# Patient Record
Sex: Female | Born: 1975 | Race: White | Hispanic: No | Marital: Married | Smoking: Never smoker
Health system: Southern US, Community
[De-identification: ages and names within clinical notes are randomized; demographics above are authoritative.]

## PROBLEM LIST (undated history)

## (undated) DIAGNOSIS — L509 Urticaria, unspecified: Secondary | ICD-10-CM

## (undated) DIAGNOSIS — M419 Scoliosis, unspecified: Secondary | ICD-10-CM

## (undated) DIAGNOSIS — R87619 Unspecified abnormal cytological findings in specimens from cervix uteri: Secondary | ICD-10-CM

## (undated) DIAGNOSIS — F418 Other specified anxiety disorders: Secondary | ICD-10-CM

## (undated) DIAGNOSIS — J302 Other seasonal allergic rhinitis: Secondary | ICD-10-CM

## (undated) DIAGNOSIS — H05839 Thyroid orbitopathy, unspecified orbit: Secondary | ICD-10-CM

## (undated) DIAGNOSIS — E0500 Thyrotoxicosis with diffuse goiter without thyrotoxic crisis or storm: Secondary | ICD-10-CM

## (undated) DIAGNOSIS — B019 Varicella without complication: Secondary | ICD-10-CM

## (undated) DIAGNOSIS — G43909 Migraine, unspecified, not intractable, without status migrainosus: Secondary | ICD-10-CM

## (undated) DIAGNOSIS — T7840XA Allergy, unspecified, initial encounter: Secondary | ICD-10-CM

## (undated) DIAGNOSIS — L719 Rosacea, unspecified: Secondary | ICD-10-CM

## (undated) DIAGNOSIS — M47816 Spondylosis without myelopathy or radiculopathy, lumbar region: Secondary | ICD-10-CM

## (undated) DIAGNOSIS — L930 Discoid lupus erythematosus: Secondary | ICD-10-CM

## (undated) DIAGNOSIS — E559 Vitamin D deficiency, unspecified: Secondary | ICD-10-CM

## (undated) DIAGNOSIS — Z9289 Personal history of other medical treatment: Secondary | ICD-10-CM

## (undated) DIAGNOSIS — K219 Gastro-esophageal reflux disease without esophagitis: Secondary | ICD-10-CM

## (undated) HISTORY — DX: Discoid lupus erythematosus: L93.0

## (undated) HISTORY — DX: Spondylosis without myelopathy or radiculopathy, lumbar region: M47.816

## (undated) HISTORY — DX: Other specified anxiety disorders: F41.8

## (undated) HISTORY — DX: Thyrotoxicosis with diffuse goiter without thyrotoxic crisis or storm: E05.00

## (undated) HISTORY — PX: SKIN LESION EXCISION: SHX2412

## (undated) HISTORY — DX: Vitamin D deficiency, unspecified: E55.9

## (undated) HISTORY — DX: Varicella without complication: B01.9

## (undated) HISTORY — DX: Gastro-esophageal reflux disease without esophagitis: K21.9

## (undated) HISTORY — DX: Rosacea, unspecified: L71.9

## (undated) HISTORY — DX: Personal history of other medical treatment: Z92.89

## (undated) HISTORY — DX: Migraine, unspecified, not intractable, without status migrainosus: G43.909

## (undated) HISTORY — DX: Thyroid orbitopathy, unspecified orbit: H05.839

## (undated) HISTORY — DX: Other seasonal allergic rhinitis: J30.2

## (undated) HISTORY — DX: Allergy, unspecified, initial encounter: T78.40XA

## (undated) HISTORY — DX: Urticaria, unspecified: L50.9

## (undated) HISTORY — DX: Unspecified abnormal cytological findings in specimens from cervix uteri: R87.619

## (undated) HISTORY — DX: Scoliosis, unspecified: M41.9

---
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
## 1998-10-26 HISTORY — PX: WISDOM TOOTH EXTRACTION: SHX21

## 2002-10-26 HISTORY — PX: DILATION AND CURETTAGE OF UTERUS: SHX78

## 2005-05-15 ENCOUNTER — Ambulatory Visit (HOSPITAL_COMMUNITY): Admission: RE | Admit: 2005-05-15 | Discharge: 2005-05-15 | Payer: Self-pay | Admitting: Obstetrics and Gynecology

## 2005-10-26 DIAGNOSIS — R87619 Unspecified abnormal cytological findings in specimens from cervix uteri: Secondary | ICD-10-CM

## 2005-10-26 DIAGNOSIS — F418 Other specified anxiety disorders: Secondary | ICD-10-CM

## 2005-10-26 HISTORY — DX: Other specified anxiety disorders: F41.8

## 2005-10-26 HISTORY — DX: Unspecified abnormal cytological findings in specimens from cervix uteri: R87.619

## 2005-12-25 ENCOUNTER — Inpatient Hospital Stay (HOSPITAL_COMMUNITY): Admission: AD | Admit: 2005-12-25 | Discharge: 2005-12-27 | Payer: Self-pay | Admitting: Obstetrics & Gynecology

## 2006-01-04 ENCOUNTER — Ambulatory Visit (HOSPITAL_COMMUNITY): Admission: RE | Admit: 2006-01-04 | Discharge: 2006-01-04 | Payer: Self-pay | Admitting: Obstetrics & Gynecology

## 2006-01-04 ENCOUNTER — Encounter (INDEPENDENT_AMBULATORY_CARE_PROVIDER_SITE_OTHER): Payer: Self-pay | Admitting: *Deleted

## 2006-02-09 ENCOUNTER — Other Ambulatory Visit: Admission: RE | Admit: 2006-02-09 | Discharge: 2006-02-09 | Payer: Self-pay | Admitting: Obstetrics and Gynecology

## 2009-03-13 ENCOUNTER — Ambulatory Visit: Payer: Self-pay | Admitting: Obstetrics & Gynecology

## 2009-04-24 ENCOUNTER — Ambulatory Visit: Payer: Self-pay | Admitting: Obstetrics & Gynecology

## 2009-08-28 ENCOUNTER — Ambulatory Visit: Payer: Self-pay | Admitting: Obstetrics & Gynecology

## 2009-11-20 ENCOUNTER — Ambulatory Visit: Payer: Self-pay | Admitting: Obstetrics & Gynecology

## 2011-03-10 NOTE — Assessment & Plan Note (Signed)
Amy Delacruz, Amy Delacruz                  ACCOUNT NO.:  [id number]   MEDICAL RECORD NO.:  [id number]          PATIENT TYPE:  POB   LOCATION:  [facility] at [place]         FACILITY:  [facility]   PHYSICIAN:  Elsie Lincoln, MD      DATE OF BIRTH:  03-06-1976   DATE OF SERVICE:  04/24/2009                                  CLINIC NOTE   The patient is a 35 year old female who presents for blood pressure  check and to evaluate progress on Yaz.  The patient is coming only after  being on the pills 1 month.  It was my impression that we were starting  them for rosacea and the patient's perception of hirsutism.  However,  she thinks her period are long and wanted to shorten them a little bit.  They were, again, coming very regularly once a month, 21- to 23-day  cycles.  Since starting on the Yaz, she has had some irregular bleeding,  which can be very common when starting the pill.  We did talk about  this, and she concurs.  We did talk about taking potassium; however, she  is on no other potassium-altering drugs.  I do not think this is  necessary, but I did offer too, and the patient refused.  I would like  to talk with the patient in 2 months, which would be 2 more packs of  Yaz, to see how she is doing.  The patient admits that the most  concerning problem is the hirsutism, so she is willing to put up with  some menstrual irregularities at this point in order to control her  hirsutism.  Blood pressure today is 107/70, which is very good.  The  patient is to come back in September.           ______________________________  Elsie Lincoln, MD     KL/MEDQ  D:  04/24/2009  T:  04/25/2009  Job:  [id number]

## 2011-03-10 NOTE — Assessment & Plan Note (Signed)
Amy Delacruz, Amy Delacruz NO.:  [id number]   MEDICAL RECORD NO.:  [id number]          PATIENT TYPE:  POB   LOCATION:  [facility] at [place]         FACILITY:  [facility]   PHYSICIAN:  Elsie Lincoln, MD      DATE OF BIRTH:  [birth year]   DATE OF SERVICE:                                  CLINIC NOTE   The patient is a 35 year old female who presents to me for her yearly  exam.  Last time she presented to me when she was complaining decrease  libido and this has improved.  She has arranged for her mother to baby  sit.  Her sex life is improved somewhat.  She is still feeling very  exhausted.  She is working with her primary care physician on this.  She  periodically thinks she gets sinusitis and they treat her with Z-PAK.  They have stopped doing because they do not believe she has sinusitis.  They also treat her for low vitamin D level; however, her most recent  vitamin D level is in the 60s, they also ruled her out for anemia  __________ today.  It appears that she has not slept in the night in 7  years.  She gets up at least twice a night from either nightmares or  having to urinate.  She also wakes up when her husband uses the  bathroom.  She also wakes up from her kids waking up from the noise on  the baby monitor.  I think the patient is suffering from long-term sleep  deprivation.  I do not believe that she has sleep apnea, but I think  that she needs better sleep hygiene.  I suggested that we try 2 weeks  Ambien to see if we get her sleeping for 2 weeks to see if this improves  her energy and maybe send her to a sleep specialist if this does not  work.  She also is amendable to a trial of anti-depression medications  if this does not work, but I think that we are owing to something with  the lack of 8-hour sleep for over 7 years.  The patient is also very  anxious.  The patient is on Yasmin and plans to go off it after her  husband's vasectomy.  Post-vasectomy specimen  is clear.   PAST MEDICAL HISTORY:  Rosacea, mild depression, lethargy.   PAST SURGICAL HISTORY:  D and C in 2004, tonsillectomy.   GYN HISTORY:  Abnormal Pap smear after the birth of second child.   OBSTETRICAL HISTORY:  NSVD x2.   FAMILY HISTORY:  No changes.   REVIEW OF SYSTEMS:  As above.   SOCIAL HISTORY:  Still not smoking, occasional drinker, some caffeinated  beverages, and as above.   PHYSICAL EXAMINATION:  VITAL SIGNS:  Pulse 80, blood pressure 106/73,  weight 118, height 61 inches.  GENERAL:  Well nourished, well developed, no apparent stress.  The  patient has an improved affect than the last time I saw her.  THYROID:  No masses.  LUNGS:  Clear to auscultation bilaterally.  HEART:  Regular rate and rhythm.  BREASTS:  No masses, nontender.  No lymphadenopathy.  ABDOMEN:  Soft, nontender.  No organomegaly.  No hernia.  GENITALIA:  Tanner V.  Vagina pink.  Normal rugae.  Cervix closed,  nontender.  Uterus nontender.  Adnexae no masses, nontender.  No  cystocele, no rectocele, no hemorrhoids.  EXTREMITIES:  No edema, nontender.   ASSESSMENT/PLAN:  A 35 year old female with yearly exam.  1. Pap smear.  2. Lack of good sleep hygiene.  Ambien 5-10 mg p.o. at bedtime p.r.n.  3. The patient to call back and see how she is doing a week to be the      consider a trial of anti-depression medication if this does not      work.  See above note for more details.           ______________________________  Elsie Lincoln, MD     KL/MEDQ  D:  11/20/2009  T:  11/21/2009  Job:  [id number]

## 2011-03-10 NOTE — Assessment & Plan Note (Signed)
Amy Delacruz, Amy Delacruz                  ACCOUNT NO.:  [id number]   MEDICAL RECORD NO.:  [id number]          PATIENT TYPE:  POB   LOCATION:  [facility] [place]         FACILITY:  [facility]   PHYSICIAN:  Elsie Lincoln, MD      DATE OF BIRTH:  1975/12/02   DATE OF SERVICE:  03/13/2009                                  CLINIC NOTE   HISTORY:  The patient is a 35 year old G2, P2 female who presents for  hair growth and hormonal issues.  The patient has seen by primary care  doctor, has had normal TSH.  She is on vitamin D supplementations.  She  is also seeing a salon for laser hair removal.  The patient has a  history of rosacea.  She was wondering if the rosacea medications is  causing hair growth.  I did look this up on the Internet, and there was  no associated problem with hair growth.  She is complaining of yellowing  of her skin where she does bleach.  There is interaction between benzoyl  peroxide and the rosacea medications are possibly the peroxide and  bleaching can be reacting with Aczone and informed the patient of this.  The patient also has long periods, but they are regular in timing.  She  spots for several days and then has a heavy flow, it stops for one day,  then has a heavy flow for one day.  This has been consistent for many  years.  She had been on birth pills in the past, but the doctor thought  she should stop them because she thought that may be adding to her  rosacea.   PAST MEDICAL HISTORY:  Rosacea.   PAST MEDICAL SURGICAL HISTORY:  D and C in 2004, tonsillectomy with some  teeth extraction.   GYN HISTORY:  The patient has had abnormal Pap smear after the birth of  second child, she probably required was repeat Pap smears with no  freezing or  burning at the cervix.  No other gynecological history she  reports.   OB HISTORY:  NSVD x2.   CONTRACEPTIVE HISTORY:  She uses condoms for birth control.   FAMILY HISTORY:  Mother and father have blood pressure.   REVIEW OF SYSTEMS:  Negative for all 13 points.   SOCIAL HISTORY:  No smoking.  She drinks 4-6 per week and drinks 2  caffeinated beverages a day.  She lives with her husband, 2 daughters,  and a dog.   PHYSICAL EXAMINATION:  GENERAL:  Well nourished, well developed, no  apparent distress.  HEENT:  Normocephalic and atraumatic.  There is a very minimal hair on  her upper lip, bleached or dark.  The patient does admit that she has  possibly been overpicky, however, I do not see any major hair issue.  There is no hair on her nipples, chin, or abdomen.   ASSESSMENT AND PLAN:  67. A 35 year old female with increased hair growth.  2. We will start oral contraceptives.  We started Yaz.  The      drospirenone help increase sex hormone-binding globulin, also is      very similar to spironolactone and  may help hair growth.  The      patient was warned of increased risk of clots with this drug.  3. The patient to see laser hair removal.  The patient may possibly      need electrolysis as her hair does not appear dark enough to      benefit from laser.  4. Return to clinic in 2 months to check blood pressure.  5. Two samples of Yaz were given.           ______________________________  Elsie Lincoln, MD     KL/MEDQ  D:  03/13/2009  T:  03/14/2009  Job:  [id number]

## 2011-03-10 NOTE — Assessment & Plan Note (Signed)
NAMEMARTINA, BRODBECK NO.:  [id number]   MEDICAL RECORD NO.:  [id number]          PATIENT TYPE:  POB   LOCATION:  [facility] at [place]         FACILITY:  [facility]   PHYSICIAN:  Elsie Lincoln, MD      DATE OF BIRTH:  01-06-1976   DATE OF SERVICE:  08/28/2009                                  CLINIC NOTE   The patient is a 35 year old female who complains of decreased libido  and feeling very tired.  She is also very irritable.  She is variable to  tolerance for things, she just thinks this is Yaz.  We had a long talk  about her daily life, her husband's life, their schedule, and it does  not sound like they have much scheduled time alone.  The patient also  does not have much scheduled time for herself.  She falls into bed  exhausted, around the same time she puts her children into bed, and  there is no time for sex.  She always feels that the children are going  to come into the bedroom, they do, they are in the bedroom in the  morning, so there is no time for sex in the morning.  She is worried  about finances, so she did not hire a baby-sitter.  Her mother is  recently recovering from an illness, who is her usual baby-sitter, and  so she is unable to help now.  We talked about strategies for planning  date nights and plan times for sex and the fact that hiring a baby-  sitter is much less expensive than hiring a counselor and a divorce  attorney, and the patient agreed that she will be scheduling some time  to be with her husband alone and also some alone time for herself where  she can try to relax and pay attention to her own needs.  The patient  will call me back if she feels like that she needs to go on something  for anxiety or depression, at this time she does not.  The patient does  seem to have a strong component of anxiety.           ______________________________  Elsie Lincoln, MD     KL/MEDQ  D:  09/04/2009  T:  09/05/2009  Job:  [id number]

## 2011-03-13 NOTE — Op Note (Signed)
Amy Delacruz, Amy Delacruz                  ACCOUNT NO.:  [id number]   MEDICAL RECORD NO.:  [id number]          PATIENT TYPE:  AMB   LOCATION:  [facility]                           FACILITY:  [facility]   PHYSICIAN:  Freddy Finner, M.D.   DATE OF BIRTH:  [birth year]   DATE OF PROCEDURE:  01/04/2006  DATE OF DISCHARGE:                                 OPERATIVE REPORT   PREOPERATIVE DIAGNOSIS:  Retained placenta.   POSTOPERATIVE DIAGNOSIS:  Retained placenta.   OPERATION/PROCEDURE:  Dilation and evacuation.   SURGEON:  Freddy Finner, M.D.   ANESTHESIA:  Spinal.   ESTIMATED BLOOD LOSS:  200 mL.   COMPLICATIONS:  None.   INDICATIONS:  The patient is a 35 year old who presents now approximately  six days after delivery with persistent heavy vaginal bleeding requiring a  pad about every two to three hours throughout the day.  Initially on her  exam in the office, there was no excessive bleeding noted and the uterus was  approximately 12-14 weeks size on the anterior.  Pelvic ultrasound in the  office did reveal placental fragment.  She is admitted now for resection of  that fragment.  She was admitted on the afternoon of surgery.   DESCRIPTION OF PROCEDURE:  She was placed under spinal anesthesia.  She was  given 1 g of Ancef preoperatively.  She was brought to the operating room  and placed under spinal anesthesia, placed in the dorsal recumbent position.  Betadine prep with __________ vagina carried out.  Sterile drapes were  applied.  Cervix was visualized grasped with a ring forceps on the anterior  lip.  The posterior weighted vaginal retractors were placed.  The large  placental forceps were then used to retrieve tissue. Gentle thorough  curettage was carried out with a Taxus curet, meaning a very large sharp  curet.  Reexploration eventually recovered an approximately 5 x 3 x 3 cm  placental fragment which submitted for histologic examination.  Repeat  curettage and repeat exploration with  placental forceps produced no  additional material.  The patient was given IV Pitocin. She was given IM and  IV Methergine.  She had rather brisk bleeding immediately on completion of  the procedure.  This resolved with these medications and massage.  She was  taken to the recovery room in good condition.   She will be given Darvocet to be taken as needed for postoperative pain.  She is recommended to follow up in the office in one week.  She is to have  progressively increasing physical activity.  No vaginal entry until after  followup in the office in seven to 10 days.  She is to report fever or  recurrent heavy bleeding.      Freddy Finner, M.D.  Electronically Signed     WRN/MEDQ  D:  01/04/2006  T:  01/04/2006  Job:  [id number]

## 2011-11-03 ENCOUNTER — Other Ambulatory Visit: Payer: Self-pay | Admitting: Family Medicine

## 2011-11-03 ENCOUNTER — Ambulatory Visit
Admission: RE | Admit: 2011-11-03 | Discharge: 2011-11-03 | Disposition: A | Discharge status: Home / Self Care | Payer: BC Managed Care – PPO | Source: Ambulatory Visit | Attending: Family Medicine | Admitting: Family Medicine

## 2011-11-03 DIAGNOSIS — M419 Scoliosis, unspecified: Secondary | ICD-10-CM

## 2011-11-03 DIAGNOSIS — M549 Dorsalgia, unspecified: Secondary | ICD-10-CM

## 2011-12-01 ENCOUNTER — Other Ambulatory Visit: Payer: Self-pay | Admitting: Surgery

## 2011-12-01 ENCOUNTER — Other Ambulatory Visit: Payer: Self-pay | Admitting: *Deleted

## 2011-12-01 DIAGNOSIS — M419 Scoliosis, unspecified: Secondary | ICD-10-CM

## 2011-12-01 DIAGNOSIS — M546 Pain in thoracic spine: Secondary | ICD-10-CM

## 2011-12-06 ENCOUNTER — Ambulatory Visit
Admission: RE | Admit: 2011-12-06 | Discharge: 2011-12-06 | Disposition: A | Discharge status: Home / Self Care | Payer: BC Managed Care – PPO | Source: Ambulatory Visit | Attending: *Deleted | Admitting: *Deleted

## 2011-12-06 DIAGNOSIS — M546 Pain in thoracic spine: Secondary | ICD-10-CM

## 2013-12-05 DIAGNOSIS — E0500 Thyrotoxicosis with diffuse goiter without thyrotoxic crisis or storm: Secondary | ICD-10-CM | POA: Insufficient documentation

## 2014-11-06 DIAGNOSIS — H0520 Unspecified exophthalmos: Secondary | ICD-10-CM | POA: Insufficient documentation

## 2014-11-06 DIAGNOSIS — R7611 Nonspecific reaction to tuberculin skin test without active tuberculosis: Secondary | ICD-10-CM

## 2014-11-06 DIAGNOSIS — L930 Discoid lupus erythematosus: Secondary | ICD-10-CM | POA: Insufficient documentation

## 2014-11-06 HISTORY — DX: Nonspecific reaction to tuberculin skin test without active tuberculosis: R76.11

## 2015-01-08 ENCOUNTER — Other Ambulatory Visit: Payer: Self-pay | Admitting: Family Medicine

## 2015-01-08 DIAGNOSIS — Z1231 Encounter for screening mammogram for malignant neoplasm of breast: Secondary | ICD-10-CM

## 2015-01-16 ENCOUNTER — Ambulatory Visit: Payer: Self-pay

## 2015-04-17 ENCOUNTER — Ambulatory Visit (INDEPENDENT_AMBULATORY_CARE_PROVIDER_SITE_OTHER): Payer: 59

## 2015-04-17 DIAGNOSIS — Z1231 Encounter for screening mammogram for malignant neoplasm of breast: Secondary | ICD-10-CM

## 2015-04-17 DIAGNOSIS — R928 Other abnormal and inconclusive findings on diagnostic imaging of breast: Secondary | ICD-10-CM

## 2015-04-18 ENCOUNTER — Other Ambulatory Visit: Payer: Self-pay | Admitting: Family Medicine

## 2015-04-18 DIAGNOSIS — R928 Other abnormal and inconclusive findings on diagnostic imaging of breast: Secondary | ICD-10-CM

## 2015-04-26 ENCOUNTER — Ambulatory Visit
Admission: RE | Admit: 2015-04-26 | Discharge: 2015-04-26 | Disposition: A | Discharge status: Home / Self Care | Payer: 59 | Source: Ambulatory Visit | Attending: Family Medicine | Admitting: Family Medicine

## 2015-04-26 DIAGNOSIS — R928 Other abnormal and inconclusive findings on diagnostic imaging of breast: Secondary | ICD-10-CM

## 2015-09-03 ENCOUNTER — Encounter: Payer: Self-pay | Admitting: Obstetrics & Gynecology

## 2015-09-03 ENCOUNTER — Ambulatory Visit (INDEPENDENT_AMBULATORY_CARE_PROVIDER_SITE_OTHER): Payer: 59 | Admitting: Obstetrics & Gynecology

## 2015-09-03 VITALS — BP 112/70 | HR 82 | Resp 16 | Ht 60.0 in

## 2015-09-03 DIAGNOSIS — N898 Other specified noninflammatory disorders of vagina: Secondary | ICD-10-CM

## 2015-09-03 DIAGNOSIS — N926 Irregular menstruation, unspecified: Secondary | ICD-10-CM | POA: Diagnosis not present

## 2015-09-03 DIAGNOSIS — E0500 Thyrotoxicosis with diffuse goiter without thyrotoxic crisis or storm: Secondary | ICD-10-CM

## 2015-09-03 LAB — POCT URINE PREGNANCY: Preg Test, Ur: NEGATIVE

## 2015-09-04 DIAGNOSIS — E0500 Thyrotoxicosis with diffuse goiter without thyrotoxic crisis or storm: Secondary | ICD-10-CM | POA: Insufficient documentation

## 2015-09-04 LAB — WET PREP BY MOLECULAR PROBE
Candida species: NEGATIVE
Gardnerella vaginalis: NEGATIVE
Trichomonas vaginosis: NEGATIVE

## 2015-09-04 NOTE — Progress Notes (Signed)
   Subjective:    Patient ID: Amy ParsonsDyan D Nuckles, female    DOB: 03/17/1976, 39 y.o.   MRN: [id number]  HPI  Pt present for longer menses the pat 3 months.  Her menses have been very regular and normal with 3 days of spotting then 2 days of heavy bleeding.  This would only occur once a month.  This last menses has lasted 2 weeks.  She is also c/o fould smelling discharge.  No abdominal pain.  Non N,V,D.  Nothing has made these complaints better or worse.  PCP checked her TSH which was normal.  Review of Systems  Constitutional: Negative.   Cardiovascular: Negative.   Gastrointestinal: Negative.   Endocrine: Negative.   Genitourinary: Positive for vaginal bleeding, vaginal discharge, vaginal pain and menstrual problem. Negative for urgency and difficulty urinating.  Psychiatric/Behavioral: Negative.    Past Medical History  Diagnosis Date  . Graves disease   . Scoliosis        Objective:   Physical Exam  Constitutional: She is oriented to person, place, and time. She appears well-developed and well-nourished. No distress.  HENT:  Head: Normocephalic and atraumatic.  Eyes: Conjunctivae are normal.  Pulmonary/Chest: Effort normal.  Abdominal: Soft. Bowel sounds are normal. She exhibits no distension and no mass. There is no tenderness. There is no rebound and no guarding.  Genitourinary:  Ext Gen:  Tanner V Vulva:  No lesion Vagin:  Retained tampon with brown discharge Cervix:  NO lesion Uterus:  Retroverted and nontender Adnexa:  NO masses, NT  Musculoskeletal: She exhibits no edema.  Neurological: She is alert and oriented to person, place, and time.  Skin: Skin is warm and dry.  Psychiatric: She has a normal mood and affect.  Vitals reviewed.         Assessment & Plan:  39 yo female with 2 new problems  1-Menorrhagia--TVUS ordered with Biopsy planned in 1 month if not resolved.  Pt would like to wait on the biospy and not due today.  2-Retained tampon--removal and wet  prep sent.  3-RTC 1 month

## 2016-02-06 ENCOUNTER — Encounter: Payer: Self-pay | Admitting: Obstetrics & Gynecology

## 2016-02-06 ENCOUNTER — Ambulatory Visit (INDEPENDENT_AMBULATORY_CARE_PROVIDER_SITE_OTHER): Payer: 59 | Admitting: Obstetrics & Gynecology

## 2016-02-06 VITALS — BP 101/63 | HR 75 | Resp 16 | Ht 60.0 in | Wt 114.0 lb

## 2016-02-06 DIAGNOSIS — Z1151 Encounter for screening for human papillomavirus (HPV): Secondary | ICD-10-CM | POA: Diagnosis not present

## 2016-02-06 DIAGNOSIS — Z01419 Encounter for gynecological examination (general) (routine) without abnormal findings: Secondary | ICD-10-CM | POA: Diagnosis not present

## 2016-02-06 DIAGNOSIS — Z124 Encounter for screening for malignant neoplasm of cervix: Secondary | ICD-10-CM

## 2016-02-06 NOTE — Progress Notes (Signed)
Subjective:    Amy Delacruz is a 40 y.o. "happily" MW P2 (313 and 42 yo daughters) female who presents for an annual exam. The patient has no complaints today. The patient is sexually active. GYN screening history: last pap: was normal. The patient wears seatbelts: yes. The patient participates in regular exercise: yes. (walking)  Has the patient ever been transfused or tattooed?: no. The patient reports that there is not domestic violence in her life.   Menstrual History: OB History    Gravida Para Term Preterm AB TAB SAB Ectopic Multiple Living   2 2 2       2       Menarche age: [age]  Patient's last menstrual period was 01/20/2016.    The following portions of the patient's history were reviewed and updated as appropriate: allergies, current medications, past family history, past medical history, past social history, past surgical history and problem list.  Review of Systems Pertinent items noted in HPI and remainder of comprehensive ROS otherwise negative.  Monogamous since high school. Denies dyspareunia. He had a vasectomy. 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 She has a low libido but doesn't want meds. She has some left nipple sensitivity monthly and has noticed some staining (clear) in the left cup of her bra over the last year.   Objective:    BP 101/63 mmHg  Pulse 75  Resp 16  Ht 5' (1.524 m)  Wt 114 lb (51.71 kg)  BMI 22.26 kg/m2  LMP 01/20/2016  General Appearance:    Alert, cooperative, no distress, appears stated age  Head:    Normocephalic, without obvious abnormality, atraumatic  Eyes:    PERRL, conjunctiva/corneas clear, EOM's intact, fundi    benign, both eyes  Ears:    Normal TM's and external ear canals, both ears  Nose:   Nares normal, septum midline, mucosa normal, no drainage    or sinus tenderness  Throat:   Lips, mucosa, and tongue normal; teeth and gums normal  Neck:   Supple, symmetrical, trachea midline, no adenopathy;    thyroid:  no  enlargement/tenderness/nodules; no carotid   bruit or JVD  Back:     Symmetric, no curvature, ROM normal, no CVA tenderness  Lungs:     Clear to auscultation bilaterally, respirations unlabored  Chest Wall:    No tenderness or deformity   Heart:    Regular rate and rhythm, S1 and S2 normal, no murmur, rub   or gallop  Breast Exam:    No tenderness, masses, or nipple abnormality  Abdomen:     Soft, non-tender, bowel sounds active all four quadrants,    no masses, no organomegaly  Genitalia:    Normal female without lesion, discharge or tenderness,NSSmid, NT     Extremities:   Extremities normal, atraumatic, no cyanosis or edema  Pulses:   2+ and symmetric all extremities  Skin:   Skin color, texture, turgor normal, no rashes or lesions  Lymph nodes:   Cervical, supraclavicular, and axillary nodes normal  Neurologic:   CNII-XII intact, normal strength, sensation and reflexes    throughout  .    Assessment:    Healthy female exam.    Plan:     Thin prep Pap smear. with cotesting Discussed mammo rec She will get a TSH and AM prolactin drawn tomorrow

## 2016-02-10 ENCOUNTER — Encounter: Payer: Self-pay | Admitting: Obstetrics & Gynecology

## 2016-02-11 LAB — CYTOLOGY - PAP

## 2016-02-20 ENCOUNTER — Ambulatory Visit (INDEPENDENT_AMBULATORY_CARE_PROVIDER_SITE_OTHER): Payer: 59 | Admitting: Obstetrics & Gynecology

## 2016-02-20 ENCOUNTER — Encounter: Payer: Self-pay | Admitting: Obstetrics & Gynecology

## 2016-02-20 VITALS — BP 104/70 | HR 74 | Resp 16 | Ht 60.0 in | Wt 114.0 lb

## 2016-02-20 DIAGNOSIS — Z01419 Encounter for gynecological examination (general) (routine) without abnormal findings: Secondary | ICD-10-CM

## 2016-02-20 DIAGNOSIS — Z0000 Encounter for general adult medical examination without abnormal findings: Secondary | ICD-10-CM

## 2016-02-20 NOTE — Progress Notes (Signed)
   Subjective:    Patient ID: Amy Delacruz, female    DOB: 11/05/1975, 40 y.o.   MRN: [id number]  HPI She is here for a breast exam.   Review of Systems     Objective:   Physical Exam Normal breast exam bilaterally       Assessment & Plan:  She reports that her thyroid meds were lowered by her primary care doc and her prolactin was normal

## 2016-02-24 ENCOUNTER — Ambulatory Visit: Payer: 59 | Admitting: Obstetrics & Gynecology

## 2016-05-04 ENCOUNTER — Other Ambulatory Visit: Payer: Self-pay | Admitting: Family Medicine

## 2016-05-04 DIAGNOSIS — Z1231 Encounter for screening mammogram for malignant neoplasm of breast: Secondary | ICD-10-CM

## 2016-05-20 ENCOUNTER — Ambulatory Visit (INDEPENDENT_AMBULATORY_CARE_PROVIDER_SITE_OTHER): Payer: 59

## 2016-05-20 DIAGNOSIS — R928 Other abnormal and inconclusive findings on diagnostic imaging of breast: Secondary | ICD-10-CM | POA: Diagnosis not present

## 2016-05-20 DIAGNOSIS — Z1231 Encounter for screening mammogram for malignant neoplasm of breast: Secondary | ICD-10-CM

## 2016-05-21 ENCOUNTER — Other Ambulatory Visit: Payer: Self-pay | Admitting: Family Medicine

## 2016-05-21 DIAGNOSIS — R928 Other abnormal and inconclusive findings on diagnostic imaging of breast: Secondary | ICD-10-CM

## 2016-05-26 ENCOUNTER — Ambulatory Visit
Admission: RE | Admit: 2016-05-26 | Discharge: 2016-05-26 | Disposition: A | Discharge status: Home / Self Care | Payer: 59 | Source: Ambulatory Visit | Attending: Family Medicine | Admitting: Family Medicine

## 2016-05-26 DIAGNOSIS — R928 Other abnormal and inconclusive findings on diagnostic imaging of breast: Secondary | ICD-10-CM

## 2016-05-29 ENCOUNTER — Other Ambulatory Visit: Payer: 59

## 2016-08-26 DIAGNOSIS — L509 Urticaria, unspecified: Secondary | ICD-10-CM

## 2016-08-26 HISTORY — DX: Urticaria, unspecified: L50.9

## 2016-09-28 ENCOUNTER — Encounter: Payer: Self-pay | Admitting: Family Medicine

## 2016-10-06 ENCOUNTER — Telehealth: Payer: Self-pay | Admitting: Family Medicine

## 2016-10-06 ENCOUNTER — Encounter: Payer: Self-pay | Admitting: Family Medicine

## 2016-10-06 ENCOUNTER — Ambulatory Visit (INDEPENDENT_AMBULATORY_CARE_PROVIDER_SITE_OTHER): Payer: 59 | Admitting: Family Medicine

## 2016-10-06 VITALS — BP 107/73 | HR 83 | Temp 97.9°F | Resp 20 | Ht 60.0 in | Wt 112.2 lb

## 2016-10-06 DIAGNOSIS — E0500 Thyrotoxicosis with diffuse goiter without thyrotoxic crisis or storm: Secondary | ICD-10-CM | POA: Diagnosis not present

## 2016-10-06 DIAGNOSIS — F411 Generalized anxiety disorder: Secondary | ICD-10-CM | POA: Diagnosis not present

## 2016-10-06 DIAGNOSIS — Z7689 Persons encountering health services in other specified circumstances: Secondary | ICD-10-CM

## 2016-10-06 DIAGNOSIS — L508 Other urticaria: Secondary | ICD-10-CM

## 2016-10-06 DIAGNOSIS — L930 Discoid lupus erythematosus: Secondary | ICD-10-CM

## 2016-10-06 DIAGNOSIS — L509 Urticaria, unspecified: Secondary | ICD-10-CM

## 2016-10-06 HISTORY — DX: Other urticaria: L50.8

## 2016-10-06 LAB — TSH: TSH: 2.54 u[IU]/mL (ref 0.35–4.50)

## 2016-10-06 LAB — T4, FREE: Free T4: 1 ng/dL (ref 0.60–1.60)

## 2016-10-06 MED ORDER — ESCITALOPRAM OXALATE 10 MG PO TABS: 10.0000 mg | ORAL_TABLET | Freq: Every day | ORAL | 0 refills | Status: DC

## 2016-10-06 MED ORDER — HYDROXYZINE PAMOATE 25 MG PO CAPS: 25.0000 mg | ORAL_CAPSULE | Freq: Every evening | ORAL | 1 refills | Status: DC | PRN

## 2016-10-06 MED ORDER — RANITIDINE HCL 150 MG PO TABS: 150.0000 mg | ORAL_TABLET | Freq: Two times a day (BID) | ORAL | 2 refills | Status: DC

## 2016-10-06 MED ORDER — FEXOFENADINE HCL 180 MG PO TABS: 180.0000 mg | ORAL_TABLET | Freq: Every day | ORAL | 0 refills | Status: DC

## 2016-10-06 NOTE — Progress Notes (Signed)
Patient ID: Amy Delacruz, female  DOB: 08/11/1976, 40 y.o.   MRN: [id number] Patient Care Team    Relationship Specialty Notifications Start End  Natalia Leatherwoodenee A Kuneff, DO PCP - General Family Medicine  10/06/16     Subjective:  Amy Delacruz is a 40 y.o.  female present for new patient establishment. All past medical history, surgical history, allergies, family history, immunizations, medications and social history were obtained and updated in the electronic medical record today. All recent labs, ED visits and hospitalizations within the last year were reviewed.  Urticaria: pt presents for establishment visit with complaints of 1 month duration of hives. She reports they are worse at night, forming mostly during sleeping hours. She has not changed any products, washing detergent etc. She reports having them 3 times in her life, nad it is always around this time of the year (2009,2011 and now). She is under a great deal of stress with her daughter having behavioral problems, and they are building a home. She states the home is costing "a lot" of money and it is stressing her out. She is always stressed around christmas as well. She has a h/o grave's disease, and is currently taking methimazole 5 mg QOD since 09/16/16. She states her TSH was 2.846 on that date, and she was taking 5 mg/2.5 mg alternating days methimazole at that time. She is taking claritin daily. She has never taken anything for anxiety in the past. She endorses difficulty sleeping because her "brain keeps turning". She states her husband feels she should try something for anxiety.    Health maintenance:  Colonoscopy: No fhx. Screen 48 Mammogram: no fhx, 05/26/2016 birads 1. Cervical cancer screening: last pap: 2017, has a gyn.  Immunizations: tdap 2012, Influenza 2017 UTD (encouraged yearly) Infectious disease screening: HIV completed 1997 DEXA: N/A [facility] 10/06/2016 10/06/2016  Decreased Interest 1 0  Down,  Depressed, Hopeless 0 0  PHQ - 2 Score 1 0   GAD 7 : Generalized Anxiety Score 10/06/2016  Nervous, Anxious, on Edge 3  Control/stop worrying 3  Worry too much - different things 3  Trouble relaxing 0  Restless 0  Easily annoyed or irritable 2  Afraid - awful might happen 2  Total GAD 7 Score 13  Anxiety Difficulty Not difficult at all      Immunization History  Administered Date(s) Administered  . Influenza-Unspecified 06/27/2015, 06/26/2016  . Tdap 12/05/2010     Past Medical History:  Diagnosis Date  . Abnormal Pap smear of cervix 2007  . Allergy   . Depression with anxiety 2007  . GERD (gastroesophageal reflux disease)   . Graves disease   . Graves' ophthalmopathy    follows with ophthalmology  . History of positive PPD    cxr negative  . Lupus erythematosus    managed by dermatology   . Migraine   . Rosacea   . Scoliosis   . Seasonal allergies   . Urticaria 08/2016  . Varicella   . Vitamin D deficiency    No Known Allergies Past Surgical History:  Procedure Laterality Date  . DILATION AND CURETTAGE OF UTERUS  2004   2004,2007  . SKIN LESION EXCISION    . TONSILLECTOMY  1978  . WISDOM TOOTH EXTRACTION  2000   Family History  Problem Relation Age of Onset  . Breast cancer Paternal Grandmother [id number]  . Hypertension Mother   . Lung disease Mother   . Hypertension Father   .  Bleeding Disorder Father   . Stroke Maternal Grandmother   . Hypertension Maternal Grandmother   . Depression Sister   . Depression Maternal Aunt    Social History   Social History  . Marital status: Married    Spouse name: Elijah Birk  . Number of children: 2  . Years of education: Post grad   Occupational History  . stay at home mother    Social History Main Topics  . Smoking status: Never Smoker  . Smokeless tobacco: Never Used  . Alcohol use 2.4 oz/week    4 Cans of beer per week  . Drug use: No  . Sexual activity: Yes    Partners: Male    Birth control/ protection:  Surgical     Comment: husband   Other Topics Concern  . Not on file   Social History Narrative   Married to [place]. 2 children Amy Delacruz and Amy Delacruz.    SAHM. Post grad education.    Nonsmoker.    Drinks caffeine, takes a daily vitamin   Wears seatbelt, bicycle helmet, exercises routinely.    Smoke detector in the home.      Medication List       Accurate as of 10/06/16 12:27 PM. Always use your most recent med list.          fexofenadine 180 MG tablet Commonly known as:  ALLEGRA ALLERGY Take 1 tablet (180 mg total) by mouth daily.   hydrOXYzine 25 MG capsule Commonly known as:  VISTARIL Take 1-2 capsules (25-50 mg total) by mouth at bedtime as needed.   ibuprofen 600 MG tablet Commonly known as:  ADVIL,MOTRIN Take 600 mg by mouth.   methimazole 5 MG tablet Commonly known as:  TAPAZOLE Take 2.5 mg by mouth.   multivitamin tablet Take by mouth.   ranitidine 150 MG tablet Commonly known as:  ZANTAC Take 1 tablet (150 mg total) by mouth 2 (two) times daily.   RELPAX 20 MG tablet Generic drug:  eletriptan TAKE ONE TABLET BY MOUTH AT ONSET OF HEADACHE AND MAY REPEAT IN 2 HOURS IF NEEDED   triamcinolone cream 0.1 % Commonly known as:  KENALOG Apply topically.   Vitamin D-3 1000 units Caps Take 1 tablet by mouth.        No results found for this or any previous visit (from the past 2160 hour(s)).  US Breast Ltd Uni Left Inc Axilla  Result Date: 05/26/2016 CLINICAL DATA:  Screening recall for left breast asymmetry. EXAM: 2D DIGITAL DIAGNOSTIC UNILATERAL LEFT MAMMOGRAM WITH CAD AND ADJUNCT TOMO LEFT BREAST ULTRASOUND COMPARISON:  Previous exam(s). ACR Breast Density Category c: The breast tissue is heterogeneously dense, which may obscure small masses. FINDINGS: The asymmetry in the lower-inner aspect of the left breast appears to resolve with additional diagnostic images, however, due to the density of breast tissue in this area, ultrasound will be performed for  further evaluation. Mammographic images were processed with CAD. Physical exam of the lower-inner aspect of the left breast demonstrates normal fibroglandular tissue without discrete palpable mass. Ultrasound of the lower-inner left breast demonstrates normal fibroglandular tissue. No masses or suspicious areas of shadowing are identified. IMPRESSION: 1. There is resolution of the asymmetry of concern in the lower-inner aspect of the left breast. No mammographic or targeted sonographic evidence of left breast malignancy. RECOMMENDATION: Screening mammogram in one year.(Code:SM-B-01Y) I have discussed the findings and recommendations with the patient. Results were also provided in writing at the conclusion of the visit. If applicable, a  reminder letter will be sent to the patient regarding the next appointment. BI-RADS CATEGORY  1: Negative. Electronically Signed   By: Frederico HammanMichelle  Collins M.D.   On: 05/26/2016 14:15   Mm Diag Breast Tomo Uni Left  Result Date: 05/26/2016 CLINICAL DATA:  Screening recall for left breast asymmetry. EXAM: 2D DIGITAL DIAGNOSTIC UNILATERAL LEFT MAMMOGRAM WITH CAD AND ADJUNCT TOMO LEFT BREAST ULTRASOUND COMPARISON:  Previous exam(s). ACR Breast Density Category c: The breast tissue is heterogeneously dense, which may obscure small masses. FINDINGS: The asymmetry in the lower-inner aspect of the left breast appears to resolve with additional diagnostic images, however, due to the density of breast tissue in this area, ultrasound will be performed for further evaluation. Mammographic images were processed with CAD. Physical exam of the lower-inner aspect of the left breast demonstrates normal fibroglandular tissue without discrete palpable mass. Ultrasound of the lower-inner left breast demonstrates normal fibroglandular tissue. No masses or suspicious areas of shadowing are identified. IMPRESSION: 1. There is resolution of the asymmetry of concern in the lower-inner aspect of the left  breast. No mammographic or targeted sonographic evidence of left breast malignancy. RECOMMENDATION: Screening mammogram in one year.(Code:SM-B-01Y) I have discussed the findings and recommendations with the patient. Results were also provided in writing at the conclusion of the visit. If applicable, a reminder letter will be sent to the patient regarding the next appointment. BI-RADS CATEGORY  1: Negative. Electronically Signed   By: Frederico HammanMichelle  Collins M.D.   On: 05/26/2016 14:15     ROS: 14 pt review of systems performed and negative (unless mentioned in an HPI)  Objective: BP 107/73 (BP Location: Right Arm, Patient Position: Sitting, Cuff Size: Normal)   Pulse 83   Temp 97.9 F (36.6 C)   Resp 20   Ht 5' (1.524 m)   Wt 112 lb 4 oz (50.9 kg)   SpO2 98%   BMI 21.92 kg/m  Gen: Afebrile. No acute distress. Nontoxic in appearance, well-developed, well-nourished,  Pleasant caucasian female.  HENT: AT. [place].  MMM Eyes:Pupils Equal Round Reactive to light, Extraocular movements intact,  Conjunctiva without redness, discharge or icterus. Neck/lymp/endocrine: Supple,no lymphadenopathy, no thyromegaly CV: RRR, no murmur Chest: CTAB, no wheeze, rhonchi or crackles.  Abd: Soft.NTND. BS present.  Skin: no rashes, purpura or petechiae.  Neuro/Msk: Normal gait. PERLA. EOMi. Alert. Oriented x3.   Psych: mild anxiety. Otherwise, Normal affect, dress and demeanor. Normal speech. Normal thought content and judgment.   Assessment/plan: Kendyll D Wenig is a 40 y.o. female present for new pt establishment with acute complaint.  Urticaria/anxiety: - urticaria resolved by appt, occurs nightly. Discussed urticaria can have many causes and the majority of cases the cause is not well defined. In her case thyroid and/or anxiety could be he potential triggers. Thyroid levels were normal during the first presentation of her hives in November.  - will repeat her levels today. Mostly bc she has started to take less of  her medication and her levels were normal then. Concern she will require 5/2.5 mg alternated of methimazole.  - Will prescribed allegra and zantac for daytime use to block Histamine, and vistaril at night to help with stress, sleep and hives.Vistaril 25-50 mg QHS dependent on sedation and hives.  - discussed her anxiety in detail and potential medications. Discussed the properties of vistaril and stress, pt decided she did not want to start daily SSRI/SNRI at this time. She then called back in the office after her appt and decided she did want  to start a medication.  - depression and anxiety assessments completed with GAD (13):  lexapro 10 mg QD - ranitidine (ZANTAC) 150 MG tablet; Take 1 tablet (150 mg total) by mouth 2 (two) times daily.  Dispense: 60 tablet; Refill: 2 - hydrOXYzine (VISTARIL) 25 MG capsule; Take 1-2 capsules (25-50 mg total) by mouth at bedtime as needed.  Dispense: 60 capsule; Refill: 1 - TSH - fexofenadine (ALLEGRA ALLERGY) 180 MG tablet; Take 1 tablet (180 mg total) by mouth daily.  Dispense: 90 tablet; Refill: 0 - f/u 4 weeks   Graves disease - she has an endocrinologist, however she does not got them frequently.  - I have concerns she self lowered her medication, when her panel was normal ~2 weeks ago.  - she has an ophthalmologist to follow eye exams as well.  - TSH - T3 - T4, free - pt will be called with results and dose adjusted If needed.   Return in about 4 weeks (around 11/03/2016), or Hives.  Greater than 45 minutes was spent with patient, greater than 50% of that time was spent face-to-face with patient    Electronically signed by: Felix Pacini, DO [facility] [place]

## 2016-10-06 NOTE — Telephone Encounter (Signed)
I have called in lexapro 10 mg for her to start. This is a daily medication. This is a low dose. Followup in 4 weeks (before medication runs out) and if needed can increase dose at that time.

## 2016-10-06 NOTE — Telephone Encounter (Signed)
Patient has decided she would like to try the anti anxiety medication that was mentioned during her office visit. [facility]

## 2016-10-06 NOTE — Patient Instructions (Signed)
Start the allegra in the morning.  Zantac every 12 hours.  Vistaril about 1 hour before bed. Start with 1 pill at night, you can take up to 2 pills before bed if needed (based on sedation and hives).     It was a pleasure meeting you.  F/U 4 weeks, sooner if needed.

## 2016-10-06 NOTE — Telephone Encounter (Signed)
Spoke with patient reviewed  instructions. Patient verbalized understanding. 

## 2016-10-07 ENCOUNTER — Telehealth: Payer: Self-pay | Admitting: Family Medicine

## 2016-10-07 DIAGNOSIS — E0500 Thyrotoxicosis with diffuse goiter without thyrotoxic crisis or storm: Secondary | ICD-10-CM

## 2016-10-07 DIAGNOSIS — Z5181 Encounter for therapeutic drug level monitoring: Secondary | ICD-10-CM

## 2016-10-07 LAB — T3: T3, Total: 103 ng/dL (ref 76–181)

## 2016-10-07 NOTE — Telephone Encounter (Signed)
Please call patient: Her thyroid panel is completely normal, all parameters are within normal. She should continue the current dose of medication she is taking of methimazole, and we can discuss further at her follow-up appointment in a couple weeks.

## 2016-10-07 NOTE — Telephone Encounter (Signed)
Patient notified and verbalized understanding. 

## 2016-10-13 ENCOUNTER — Telehealth: Payer: Self-pay | Admitting: *Deleted

## 2016-10-13 NOTE — Telephone Encounter (Signed)
Pt will have hives, the antihistamine will help and it can take time to calm down the response system that causes hives. The antihistamines should not cause abd pain. Small potential the lexapro could cause abd discomfort and nausea.  We could try a different medicine for anxiety. I would encouraged her to restart the medications for the hives.

## 2016-10-13 NOTE — Telephone Encounter (Signed)
Patient called and left message stating she is still having hives but now also has abdominal pain. She has discontinued her medications because she thinks this is causing the abdominal pain. Please advise.

## 2016-10-14 NOTE — Telephone Encounter (Signed)
Left detailed message on patient voice mail with instructions and information. Instructed patient to call back if she would like to try different medication for anxiety.

## 2016-10-28 ENCOUNTER — Encounter: Payer: Self-pay | Admitting: Family Medicine

## 2016-10-30 ENCOUNTER — Ambulatory Visit: Payer: 59 | Admitting: Family Medicine

## 2016-11-03 ENCOUNTER — Encounter: Payer: Self-pay | Admitting: Family Medicine

## 2016-11-03 ENCOUNTER — Ambulatory Visit (INDEPENDENT_AMBULATORY_CARE_PROVIDER_SITE_OTHER): Payer: 59 | Admitting: Family Medicine

## 2016-11-03 VITALS — BP 108/73 | HR 73 | Temp 97.9°F | Resp 20 | Ht 60.0 in | Wt 113.0 lb

## 2016-11-03 DIAGNOSIS — F411 Generalized anxiety disorder: Secondary | ICD-10-CM | POA: Diagnosis not present

## 2016-11-03 DIAGNOSIS — L509 Urticaria, unspecified: Secondary | ICD-10-CM | POA: Diagnosis not present

## 2016-11-03 MED ORDER — DOXEPIN HCL 10 MG PO CAPS: 10.0000 mg | ORAL_CAPSULE | Freq: Every day | ORAL | 2 refills | Status: DC

## 2016-11-03 NOTE — Progress Notes (Signed)
Patient ID: [name], female  DOB: 03-22-1976, 41 y.o.   MRN: [id number] Patient Care Team    Relationship Specialty Notifications Start End  Amy Leatherwood, DO PCP - General Family Medicine  10/06/16   Amy Fillers, MD    10/07/16    Comment: endocrine  Amy Grana, MD Referring Physician Dermatology  10/07/16   Amy Bossier, MD Consulting Physician Obstetrics and Gynecology  10/07/16     Subjective:  Amy Delacruz is a 41 y.o.  female present for new patient establishment. All past medical history, surgical history, allergies, family history, immunizations, medications and social history were obtained and updated in the electronic medical record today. All recent labs, ED visits and hospitalizations within the last year were reviewed.  Urticaria: pt presents for establishment visit with complaints of 1 month duration of hives. She reports they are worse at night, forming mostly during sleeping hours. She has not changed any products, washing detergent etc. She reports having them 3 times in her life, nad it is always around this time of the year (2009,2011 and now). She is under a great deal of stress with her daughter having behavioral problems, and they are building a home. She states the home is costing "a lot" of money and it is stressing her out. She is always stressed around christmas as well. She has a h/o grave's disease, and is currently taking methimazole 5 mg QOD since 09/16/16. She states her TSH was 2.846 on that date, and she was taking 5 mg/2.5 mg alternating days methimazole at that time. She is taking claritin daily. She has never taken anything for anxiety in the past. She endorses difficulty sleeping because her "brain keeps turning". She states her husband feels she should try something for anxiety.    Health maintenance:  Colonoscopy: No fhx. Screen 48 Mammogram: no fhx, 05/26/2016 birads 1. Cervical cancer screening: last pap: 2017, has a gyn.  Immunizations:  tdap 2012, Influenza 2017 UTD (encouraged yearly) Infectious disease screening: HIV completed 1997 DEXA: N/A Depression screen [facility] 2/9 10/06/2016 10/06/2016  Decreased Interest 1 0  Down, Depressed, Hopeless 0 0  PHQ - 2 Score 1 0   GAD 7 : Generalized Anxiety Score 10/06/2016  Nervous, Anxious, on Edge 3  Control/stop worrying 3  Worry too much - different things 3  Trouble relaxing 0  Restless 0  Easily annoyed or irritable 2  Afraid - awful might happen 2  Total GAD 7 Score 13  Anxiety Difficulty Not difficult at all      Immunization History  Administered Date(s) Administered  . Influenza-Unspecified 06/27/2015, 06/26/2016  . Tdap 12/05/2010     Past Medical History:  Diagnosis Date  . Abnormal Pap smear of cervix 2007  . Allergy   . Depression with anxiety 2007  . GERD (gastroesophageal reflux disease)   . Graves disease   . Graves' ophthalmopathy    follows with ophthalmology  . History of positive PPD    cxr negative  . Lupus erythematosus    managed by dermatology   . Migraine   . Rosacea   . Scoliosis   . Seasonal allergies   . Urticaria 08/2016  . Varicella   . Vitamin D deficiency    No Known Allergies Past Surgical History:  Procedure Laterality Date  . DILATION AND CURETTAGE OF UTERUS  2004   2004,2007  . SKIN LESION EXCISION    . TONSILLECTOMY  1978  . WISDOM  TOOTH EXTRACTION  2000   Family History  Problem Relation Age of Onset  . Breast cancer Paternal Grandmother 7350  . Hypertension Mother   . Lung disease Mother   . Hypertension Father   . Bleeding Disorder Father   . Stroke Maternal Grandmother   . Hypertension Maternal Grandmother   . Depression Sister   . Depression Maternal Aunt    Social History   Social History  . Marital status: Married    Spouse name: Amy Delacruz  . Number of children: 2  . Years of education: Post grad   Occupational History  . stay at home mother    Social History Main Topics  . Smoking status:  Never Smoker  . Smokeless tobacco: Never Used  . Alcohol use 2.4 oz/week    4 Cans of beer per week  . Drug use: No  . Sexual activity: Yes    Partners: Male    Birth control/ protection: Surgical     Comment: husband   Other Topics Concern  . Not on file   Social History Narrative   Married to Amy Delacruz. 2 children Amy BradfordKimberly and Amy Delacruz.    SAHM. Post grad education.    Nonsmoker.    Drinks caffeine, takes a daily vitamin   Wears seatbelt, bicycle helmet, exercises routinely.    Smoke detector in the home.    Allergies as of 11/03/2016   No Known Allergies     Medication List       Accurate as of 11/03/16 11:54 AM. Always use your most recent med list.          doxepin 10 MG capsule Commonly known as:  SINEQUAN Take 1 capsule (10 mg total) by mouth at bedtime.   escitalopram 10 MG tablet Commonly known as:  LEXAPRO Take 1 tablet (10 mg total) by mouth daily.   fexofenadine 180 MG tablet Commonly known as:  ALLEGRA ALLERGY Take 1 tablet (180 mg total) by mouth daily.   hydrOXYzine 25 MG capsule Commonly known as:  VISTARIL Take 1-2 capsules (25-50 mg total) by mouth at bedtime as needed.   ibuprofen 600 MG tablet Commonly known as:  ADVIL,MOTRIN Take 600 mg by mouth.   methimazole 5 MG tablet Commonly known as:  TAPAZOLE Take 2.5 mg by mouth.   multivitamin tablet Take by mouth.   ranitidine 150 MG tablet Commonly known as:  ZANTAC Take 1 tablet (150 mg total) by mouth 2 (two) times daily.   RELPAX 20 MG tablet Generic drug:  eletriptan TAKE ONE TABLET BY MOUTH AT ONSET OF HEADACHE AND MAY REPEAT IN 2 HOURS IF NEEDED   triamcinolone cream 0.1 % Commonly known as:  KENALOG Apply topically.   Vitamin D-3 1000 units Caps Take 1 tablet by mouth.        Recent Results (from the past 2160 hour(s))  TSH     Status: None   Collection Time: 10/06/16 11:04 AM  Result Value Ref Range   TSH 2.54 0.35 - 4.50 uIU/mL  T3     Status: None   Collection Time:  10/06/16 11:04 AM  Result Value Ref Range   T3, Total 103.0 76 - 181 ng/dL  T4, free     Status: None   Collection Time: 10/06/16 11:04 AM  Result Value Ref Range   Free T4 1.00 0.60 - 1.60 ng/dL    Comment: Specimens from patients who are undergoing biotin therapy and /or ingesting biotin supplements may contain high levels of biotin.  The higher biotin concentration in these specimens interferes with this Free T4 assay.  Specimens that contain high levels  of biotin may cause false high results for this Free T4 assay.  Please interpret results in light of the total clinical presentation of the patient.       ROS: 14 pt review of systems performed and negative (unless mentioned in an HPI)  Objective: BP 108/73 (BP Location: Right Arm, Patient Position: Sitting, Cuff Size: Normal)   Pulse 73   Temp 97.9 F (36.6 C) (Oral)   Resp 20   Ht 5' (1.524 m)   Wt 113 lb (51.3 kg)   SpO2 98%   BMI 22.07 kg/m  Gen: Afebrile. No acute distress.  HENT: AT. [place]. MMM.  Eyes:Pupils Equal Round Reactive to light, Extraocular movements intact,  Conjunctiva without redness, discharge or icterus. Neck/lymp/endocrine: Supple, no thyromegaly CV: RRR Skin: splotchy flat macular rash neck and face. No purpura or petechiae.  Psych: Normal affect, dress and demeanor. Normal speech. Normal thought content and judgment.   Assessment/plan: Amy Delacruz is a 41 y.o. female present for follow up on hives Urticaria/anxiety: - again, discussed in length the nature of chronic hives. She initially saw a change with treatment, but now worsening hives. She seems to have difficulty understanding the potential chronicity and multifactorial causes of chronic hives.  - Hives are worse and night and resolve by late afternoon.  - pt was offered Derm referral and declined.  - anxiety is improved either by lexapro or life situation.  - Thyroid levels were normal.  - Stop lexapro,anxiety coverage with doxepin - start  doxepin 10 mg QHS, may increase up to 30 mg QHS if needed. - continue allegra at night.  - +/- zantac and vistaril if desired, caution on sedation with vistaril and doxepin. Pt desiring least amount of pills.  - f/u 4 weeks if needing increase in regimen or sooner if side effects. Otherwise can follow up in 3 months.   > 25 minutes spent with patient, >50% of time spent face to face counseling patient   Return in about 3 months (around 02/01/2017).   Electronically signed by: Felix Pacini, DO [facility] [place]

## 2016-11-03 NOTE — Patient Instructions (Addendum)
Stop lexapro. Continue Allegra once daily before bed and start doxepin at night.  You can use zantac still if desired.  You can still use vistaril, as long as not too tired with doxepin, if desired.  Followup in 3 months if improved, if worsening follow up in 4 weeks and we can increase doxepin dose.     Hives Introduction Hives (urticaria) are itchy, red, swollen areas on your skin. Hives can show up on any part of your body, and they can vary in size. They can be as small as the tip of a pen or much larger. Hives often fade within 24 hours (acute hives). In other cases, new hives show up after old ones fade. This can continue for many days or weeks (chronic hives). Hives are caused by your body's reaction to an irritant or to something that you are allergic to (trigger). You can get hives right after being around a trigger or hours later. Hives do not spread from person to person (are not contagious). Hives may get worse if you scratch them, if you exercise, or if you have worries (emotional stress). Follow these instructions at home: Medicines  Take or apply over-the-counter and prescription medicines only as told by your doctor.  If you were prescribed an antibiotic medicine, use it as told by your doctor. Do not stop taking the antibiotic even if you start to feel better. Skin Care  Apply cool, wet cloths (cool compresses) to the itchy, red, swollen areas.  Do not scratch your skin. Do not rub your skin. General instructions  Do not take hot showers or baths. This can make itching worse.  Do not wear tight clothes.  Use sunscreen and wear clothing that covers your skin when you are outside.  Avoid any triggers that cause your hives. Keep a journal to help you keep track of what causes your hives. Write down:  What medicines you take.  What you eat and drink.  What products you use on your skin.  Keep all follow-up visits as told by your doctor. This is important. Contact  a doctor if:  Your symptoms are not better with medicine.  Your joints are painful or swollen. Get help right away if:  You have a fever.  You have belly pain.  Your tongue or lips are swollen.  Your eyelids are swollen.  Your chest or throat feels tight.  You have trouble breathing or swallowing. These symptoms may be an emergency. Do not wait to see if the symptoms will go away. Get medical help right away. Call your local emergency services (911 in the [place] Do not drive yourself to the hospital.  This information is not intended to replace advice given to you by your health care provider. Make sure you discuss any questions you have with your health care provider. Document Released: 07/21/2008 Document Revised: 03/19/2016 Document Reviewed: 07/31/2015  2017 Elsevier

## 2016-11-23 ENCOUNTER — Encounter: Payer: Self-pay | Admitting: Family Medicine

## 2016-11-23 ENCOUNTER — Telehealth: Payer: Self-pay | Admitting: Family Medicine

## 2016-11-23 NOTE — Telephone Encounter (Signed)
Left message on voicemail for patient to return call. 

## 2016-11-23 NOTE — Telephone Encounter (Signed)
Please call pt: - we could send her to an allergist if she would like to pursue other medications. Doxepin does not necessarily cause increased weight gain, it is a POTENTIAL side effect. If she wants to switch around medications we would need to see her to discuss.  - discontinuing all these medications or decreasing them as she reported would cause her symptoms to return. If she is having facial swelling/lips/airway then she needs to go straight to the ED, that is a medical emergency.

## 2016-11-24 ENCOUNTER — Telehealth: Payer: Self-pay | Admitting: Family Medicine

## 2016-11-24 ENCOUNTER — Ambulatory Visit (INDEPENDENT_AMBULATORY_CARE_PROVIDER_SITE_OTHER): Payer: 59 | Admitting: Family Medicine

## 2016-11-24 ENCOUNTER — Encounter: Payer: Self-pay | Admitting: Family Medicine

## 2016-11-24 VITALS — BP 116/75 | HR 90 | Temp 98.3°F | Resp 20 | Wt 116.5 lb

## 2016-11-24 DIAGNOSIS — L508 Other urticaria: Secondary | ICD-10-CM | POA: Diagnosis not present

## 2016-11-24 MED ORDER — ESCITALOPRAM OXALATE 10 MG PO TABS: ORAL_TABLET | ORAL | 0 refills | Status: DC

## 2016-11-24 NOTE — Progress Notes (Signed)
Patient ID: Amy Delacruz, female  DOB: 1976-02-05, 41 y.o.   MRN: [id number] Patient Care Team    Relationship Specialty Notifications Start End  Natalia Leatherwood, DO PCP - General Family Medicine  10/06/16   Warden Fillers, MD    10/07/16    Comment: endocrine  Delsa Grana, MD Referring Physician Dermatology  10/07/16   Allie Bossier, MD Consulting Physician Obstetrics and Gynecology  10/07/16     Subjective:  Amy Delacruz is a 41 y.o.  female present for chronic Urticaria.   Chronic Urticaria: Pt states she expected her hives to be better because the holidays are over and she thought her anxiety was improved. She admits she was only taking half of an allegra and then stopped it all together. She did not start the Doxepin. She felt she tolerated the lexapro 10 mg, but it was not effective enough for her hives. She is still taking the zantac twice a day and vistaril sometimes at night. She states she thought she would be ok with the doxepin but then was fearful of the weight gain that is a potential. Her thyroid panel was normal. She states when she was on vacation she did have an episode of facial hives with lips swelling, when she ate many different foods one day.  Prior note:  pt presents for establishment visit with complaints of 1 month duration of hives. She reports they are worse at night, forming mostly during sleeping hours. She has not changed any products, washing detergent etc. She reports having them 3 times in her life, nad it is always around this time of the year (2009,2011 and now). She is under a great deal of stress with her daughter having behavioral problems, and they are building a home. She states the home is costing "a lot" of money and it is stressing her out. She is always stressed around christmas as well. She has a h/o grave's disease, and is currently taking methimazole 5 mg QOD since 09/16/16. She states her TSH was 2.846 on that date, and she was taking 5 mg/2.5  mg alternating days methimazole at that time. She is taking claritin daily. She has never taken anything for anxiety in the past. She endorses difficulty sleeping because her "brain keeps turning". She states her husband feels she should try something for anxiety.    Depression screen [facility] 2/9 10/06/2016 10/06/2016  Decreased Interest 1 0  Down, Depressed, Hopeless 0 0  PHQ - 2 Score 1 0   GAD 7 : Generalized Anxiety Score 10/06/2016  Nervous, Anxious, on Edge 3  Control/stop worrying 3  Worry too much - different things 3  Trouble relaxing 0  Restless 0  Easily annoyed or irritable 2  Afraid - awful might happen 2  Total GAD 7 Score 13  Anxiety Difficulty Not difficult at all      Immunization History  Administered Date(s) Administered  . Influenza-Unspecified 06/27/2015, 06/26/2016  . Tdap 12/05/2010     Past Medical History:  Diagnosis Date  . Abnormal Pap smear of cervix 2007  . Allergy   . Depression with anxiety 2007  . GERD (gastroesophageal reflux disease)   . Graves disease   . Graves' ophthalmopathy    follows with ophthalmology  . History of positive PPD    cxr negative  . Lupus erythematosus    managed by dermatology   . Migraine   . Rosacea   . Scoliosis   .  Seasonal allergies   . Urticaria 08/2016  . Varicella   . Vitamin D deficiency    No Known Allergies Past Surgical History:  Procedure Laterality Date  . DILATION AND CURETTAGE OF UTERUS  2004   2004,2007  . SKIN LESION EXCISION    . TONSILLECTOMY  1978  . WISDOM TOOTH EXTRACTION  2000   Family History  Problem Relation Age of Onset  . Breast cancer Paternal Grandmother 27  . Hypertension Mother   . Lung disease Mother   . Hypertension Father   . Bleeding Disorder Father   . Stroke Maternal Grandmother   . Hypertension Maternal Grandmother   . Depression Sister   . Depression Maternal Aunt    Social History   Social History  . Marital status: Married    Spouse name: Amy Delacruz  .  Number of children: 2  . Years of education: Post grad   Occupational History  . stay at home mother    Social History Main Topics  . Smoking status: Never Smoker  . Smokeless tobacco: Never Used  . Alcohol use 2.4 oz/week    4 Cans of beer per week  . Drug use: No  . Sexual activity: Yes    Partners: Male    Birth control/ protection: Surgical     Comment: husband   Other Topics Concern  . Not on file   Social History Narrative   Married to [place]. 2 children [name] and [place].    SAHM. Post grad education.    Nonsmoker.    Drinks caffeine, takes a daily vitamin   Wears seatbelt, bicycle helmet, exercises routinely.    Smoke detector in the home.    Allergies as of 11/24/2016   No Known Allergies     Medication List       Accurate as of 11/24/16 11:08 AM. Always use your most recent med list.          escitalopram 10 MG tablet Commonly known as:  LEXAPRO 1 tab for 1 week, then 2 tabs daily.   fexofenadine 180 MG tablet Commonly known as:  ALLEGRA ALLERGY Take 1 tablet (180 mg total) by mouth daily.   hydrOXYzine 25 MG capsule Commonly known as:  VISTARIL Take 1-2 capsules (25-50 mg total) by mouth at bedtime as needed.   ibuprofen 600 MG tablet Commonly known as:  ADVIL,MOTRIN Take 600 mg by mouth.   methimazole 5 MG tablet Commonly known as:  TAPAZOLE Take 2.5 mg by mouth.   multivitamin tablet Take by mouth.   ranitidine 150 MG tablet Commonly known as:  ZANTAC Take 1 tablet (150 mg total) by mouth 2 (two) times daily.   RELPAX 20 MG tablet Generic drug:  eletriptan TAKE ONE TABLET BY MOUTH AT ONSET OF HEADACHE AND MAY REPEAT IN 2 HOURS IF NEEDED   triamcinolone cream 0.1 % Commonly known as:  KENALOG Apply topically.   Vitamin D-3 1000 units Caps Take 1 tablet by mouth.        Recent Results (from the past 2160 hour(s))  TSH     Status: None   Collection Time: 10/06/16 11:04 AM  Result Value Ref Range   TSH 2.54 0.35 - 4.50 uIU/mL    T3     Status: None   Collection Time: 10/06/16 11:04 AM  Result Value Ref Range   T3, Total 103.0 76 - 181 ng/dL  T4, free     Status: None   Collection Time: 10/06/16 11:04 AM  Result Value Ref Range   Free T4 1.00 0.60 - 1.60 ng/dL    Comment: Specimens from patients who are undergoing biotin therapy and /or ingesting biotin supplements may contain high levels of biotin.  The higher biotin concentration in these specimens interferes with this Free T4 assay.  Specimens that contain high levels  of biotin may cause false high results for this Free T4 assay.  Please interpret results in light of the total clinical presentation of the patient.       ROS: 14 pt review of systems performed and negative (unless mentioned in an HPI)  Objective: BP 116/75 (BP Location: Right Arm, Patient Position: Sitting, Cuff Size: Normal)   Pulse 90   Temp 98.3 F (36.8 C)   Resp 20   Wt 116 lb 8 oz (52.8 kg)   SpO2 100%   BMI 22.75 kg/m   Gen: Afebrile. No acute distress.  HENT: AT. [place].  MMM.  Eyes:Pupils Equal Round Reactive to light, Extraocular movements intact,  Conjunctiva without redness, discharge or icterus. Neck/lymp/endocrine: Supple Skin: no rashes, purpura or petechiae.  Neuro:  Normal gait. PERLA. EOMi. Alert. Oriented. Psych: mildly anxious. Otherwise, Normal affect, dress and demeanor. Normal speech. Normal thought content and judgment..     Assessment/plan: Elvera D Hougland is a 41 y.o. female present for follow up on hives Urticaria/anxiety: - again, discussed in length the nature of chronic hives.She still seems to have difficulty understanding the potential chronicity and multifactorial causes of chronic hives.  - DC doxepin.  - pt was offered Derm referral and declined. She is agreeable to allergy referral today.  - restart lexapro 10 mg and taper to 20 mg in 1 week.  - Thyroid levels were normal.  - continue allegra and vistaril at night.  - Continue zantac - pt urged to  go to ED if hives affect mouth, lips or any shortness of breath.  - f/u 4 weeks if needing increase in regimen or sooner if side effects. Otherwise can follow up in 3 months.   > 25 minutes spent with patient, >50% of time spent face to face counseling patient   Return in about 4 weeks (around 12/22/2016), or urticaria.   Electronically signed by: Felix Pacinienee Giann Obara, DO [facility]

## 2016-11-24 NOTE — Patient Instructions (Addendum)
Zantac every 12 hours. Lexapro daily, start 10 mg dose for 1 week , then increase to 20 mg (2 tabs) daily.  Restart allegra at night.  Vistaril at night.  I have referred you to allergist.  F/U 1 month after starting lexapro

## 2016-11-24 NOTE — Telephone Encounter (Signed)
Patient notified and verbalized understanding. Patient scheduled an appointment with Diane to discuss medications for symptoms.

## 2016-11-24 NOTE — Telephone Encounter (Signed)
Spoke with patient advised her to call allergist office and speak to a nurse for advice. Patient verbalized understanding.

## 2016-11-24 NOTE — Telephone Encounter (Signed)
I would advise her to call their office and explain the severity of her hives if she does not take the medicine and they can advise her on their thoughts.

## 2016-11-24 NOTE — Telephone Encounter (Signed)
Patient states she was seen in office today and referred to allergist.  She states the allergist called her and  scheduled her to be seen next week.  However, they instructed her to discontinue taking her antihistamines for 7 days prior to her appt.  Patient is concerned about stopping meds and wants to know if pcp thinks this is a good idea for her to be off her antihistamine.  She is requesting a call back asap so she knows what do since her appt is next week.

## 2016-12-16 ENCOUNTER — Encounter: Payer: Self-pay | Admitting: Family Medicine

## 2016-12-22 ENCOUNTER — Ambulatory Visit (INDEPENDENT_AMBULATORY_CARE_PROVIDER_SITE_OTHER): Payer: 59 | Admitting: Family Medicine

## 2016-12-22 ENCOUNTER — Encounter: Payer: Self-pay | Admitting: Family Medicine

## 2016-12-22 ENCOUNTER — Telehealth: Payer: Self-pay | Admitting: Family Medicine

## 2016-12-22 VITALS — BP 93/62 | HR 68 | Temp 98.2°F | Resp 20 | Ht 60.0 in | Wt 114.5 lb

## 2016-12-22 DIAGNOSIS — L508 Other urticaria: Secondary | ICD-10-CM

## 2016-12-22 DIAGNOSIS — E0500 Thyrotoxicosis with diffuse goiter without thyrotoxic crisis or storm: Secondary | ICD-10-CM | POA: Diagnosis not present

## 2016-12-22 LAB — TSH: TSH: 2.86 u[IU]/mL (ref 0.35–4.50)

## 2016-12-22 LAB — T4, FREE: Free T4: 0.97 ng/dL (ref 0.60–1.60)

## 2016-12-22 MED ORDER — ESCITALOPRAM OXALATE 20 MG PO TABS: 20.0000 mg | ORAL_TABLET | Freq: Every day | ORAL | 1 refills | Status: DC

## 2016-12-22 MED ORDER — METHIMAZOLE 5 MG PO TABS: 5.0000 mg | ORAL_TABLET | Freq: Every day | ORAL | 1 refills | Status: DC

## 2016-12-22 MED ORDER — ESCITALOPRAM OXALATE 20 MG PO TABS: ORAL_TABLET | ORAL | 1 refills | Status: DC

## 2016-12-22 NOTE — Patient Instructions (Signed)
I have called in lexapro for 6 months total. Follow up then for anxiety. I will call you with lab results once they are all available.   Good luck next week! I hope you get some answers.     Angioedema Angioedema is the sudden swelling of tissue in the body. Angioedema can affect any part of the body, but it most often affects the deeper parts of the skin, causing red, itchy patches (hives) to appear over the affected area. It often begins during the night and is found in the morning. Depending on the cause, angioedema may happen:  Only once.  Several times. It may come back in unpredictable patterns.  Repeatedly for several years. Over time, it may gradually stop coming back. Angioedema can be life-threatening if it affects the air passages that you breathe through. What are the causes? This condition may be caused by:  Foods, such as milk, eggs, shellfish, wheat, or nuts.  Certain medicines, such as ACE inhibitors, antibiotics, nonsteroidal anti-inflammatory drugs, birth control pills, or dyes used in X-rays.  Insect stings.  Infections. Angioedema can be inherited, and episodes can be triggered by:  Mild injury.  Dental work.  Surgery.  Stress.  Sudden changes in temperature.  Exercise. In some cases, the cause of this condition is not known. What are the signs or symptoms? Symptoms of this condition depend on where the swelling happens. Symptoms may include:  Swollen skin.  Red, itchy patches of skin (hives).  Redness in the affected area.  Pain in the affected area.  Swollen lips or tongue.  Wheezing.  Breathing problems. If your internal organs are involved, symptoms may also include:  Nausea.  Abdominal pain.  Vomiting.  Difficulty swallowing.  Difficulty passing urine. How is this diagnosed? This condition may be diagnosed based on:  An exam of the affected area.  Your medical history.  Whether anyone in your family has had this  condition before.  A review of any medicines you have been taking.  Tests, including:  Allergy skin tests to see if the condition was caused by an allergic reaction.  Blood tests to see if the condition was caused by a gene.  Tests to check for underlying diseases that could cause the condition. How is this treated? Treatment for this condition depends on the cause. It may involve any of the following:  If something triggered the condition, making changes to keep it from triggering the condition again.  If the condition affects your breathing, having tubes placed in your airway to keep it open.  Taking medicines to treat symptoms or prevent future episodes. These may include:  Antihistamines.  Epinephrine injections.  Steroids. If your condition is severe, you may need to be treated at the hospital. Angioedema usually gets better in 24-48 hours. Follow these instructions at home:  Take over-the-counter and prescription medicines only as told by your health care provider.  If you were given medicines for emergency allergy treatment, always carry them with you.  Wear a medical bracelet as told by your health care provider.  If something triggers your condition, avoid the trigger, if possible.  If your condition is inherited and you are thinking about having children, talk to your health care provider. It is important to discuss the risks of passing on the condition to your children. Contact a health care provider if:  You have repeated episodes of angioedema.  Episodes of angioedema start to happen more often than they used to, even after you take  steps to prevent them.  You have episodes of angioedema that are more severe than they have been before, even after you take steps to prevent them.  You are thinking about having children. Get help right away if:  You have severe swelling of your mouth, tongue, or lips.  You have trouble breathing.  You have trouble  swallowing.  You faint. This information is not intended to replace advice given to you by your health care provider. Make sure you discuss any questions you have with your health care provider. Document Released: 12/21/2001 Document Revised: 05/09/2016 Document Reviewed: 04/21/2016 Elsevier Interactive Patient Education  2017 [employer]

## 2016-12-22 NOTE — Addendum Note (Signed)
Addended by: Thomasena EdisWILLIAMS, Dalayza Zambrana N on: 12/22/2016 11:45 AM   Modules accepted: Orders

## 2016-12-22 NOTE — Progress Notes (Signed)
Patient ID: Amy Delacruz, female  DOB: 05/09/1976, 41 y.o.   MRN: [id number] Patient Care Team    Relationship Specialty Notifications Start End  Natalia Leatherwoodenee A Kuneff, DO PCP - General Family Medicine  10/06/16   Warden FillersJohn Lambeth, MD    10/07/16    Comment: endocrine  Delsa GranaJoseph L Jorizzo, MD Referring Physician Dermatology  10/07/16   Allie BossierMyra C Dove, MD Consulting Physician Obstetrics and Gynecology  10/07/16     Subjective:  Amy Delacruz is a 41 y.o.  female present for chronic Urticaria.   Chronic Urticaria/anxiety/graves disease:Pt is tolerating lexapro 20 mg QD. She is now holding the vistaril, allegra and zantac for 7 days prior to her allergy appt, per instruction from allergy office. She is still having hives and occassions of angioedema. She has now been on the same dose of methimazole for at least 6 weeks, will retest.   Prior note: Pt states she expected her hives to be better because the holidays are over and she thought her anxiety was improved. She admits she was only taking half of an allegra and then stopped it all together. She did not start the Doxepin. She felt she tolerated the lexapro 10 mg, but it was not effective enough for her hives. She is still taking the zantac twice a day and vistaril sometimes at night. She states she thought she would be ok with the doxepin but then was fearful of the weight gain that is a potential. Her thyroid panel was normal. She states when she was on vacation she did have an episode of facial hives with lips swelling, when she ate many different foods one day.  Prior note:  pt presents for establishment visit with complaints of 1 month duration of hives. She reports they are worse at night, forming mostly during sleeping hours. She has not changed any products, washing detergent etc. She reports having them 3 times in her life, nad it is always around this time of the year (2009,2011 and now). She is under a great deal of stress with her daughter having  behavioral problems, and they are building a home. She states the home is costing "a lot" of money and it is stressing her out. She is always stressed around christmas as well. She has a h/o grave's disease, and is currently taking methimazole 5 mg QOD since 09/16/16. She states her TSH was 2.846 on that date, and she was taking 5 mg/2.5 mg alternating days methimazole at that time. She is taking claritin daily. She has never taken anything for anxiety in the past. She endorses difficulty sleeping because her "brain keeps turning". She states her husband feels she should try something for anxiety.    Dep[facility] screen [facility] 10/06/2016 10/06/2016  Decreased Interest 1 0  Down, Depressed, Hopeless 0 0  PHQ - 2 Score 1 0   GAD 7 : Generalized Anxiety Score 10/06/2016  Nervous, Anxious, on Edge 3  Control/stop worrying 3  Worry too much - different things 3  Trouble relaxing 0  Restless 0  Easily annoyed or irritable 2  Afraid - awful might happen 2  Total GAD 7 Score 13  Anxiety Difficulty Not difficult at all      Immunization History  Administered Date(s) Administered  . Influenza-Unspecified 06/27/2015, 06/26/2016  . Tdap 12/05/2010     Past Medical History:  Diagnosis Date  . Abnormal Pap smear of cervix 2007  . Allergy   . Depression with anxiety  2007  . GERD (gastroesophageal reflux disease)   . Graves disease   . Graves' ophthalmopathy    follows with ophthalmology  . History of positive PPD    cxr negative  . Lupus erythematosus    managed by dermatology   . Migraine   . Rosacea   . Scoliosis   . Seasonal allergies   . Urticaria 08/2016  . Varicella   . Vitamin D deficiency    No Known Allergies Past Surgical History:  Procedure Laterality Date  . DILATION AND CURETTAGE OF UTERUS  2004   2004,2007  . SKIN LESION EXCISION    . TONSILLECTOMY  1978  . WISDOM TOOTH EXTRACTION  2000   Family History  Problem Relation Age of Onset  . Breast cancer  Paternal Grandmother 69  . Hypertension Mother   . Lung disease Mother   . Hypertension Father   . Bleeding Disorder Father   . Stroke Maternal Grandmother   . Hypertension Maternal Grandmother   . Depression Sister   . Depression Maternal Aunt    Social History   Social History  . Marital status: Married    Spouse name: Elijah Birk  . Number of children: 2  . Years of education: Post grad   Occupational History  . stay at home mother    Social History Main Topics  . Smoking status: Never Smoker  . Smokeless tobacco: Never Used  . Alcohol use 2.4 oz/week    4 Cans of beer per week  . Drug use: No  . Sexual activity: Yes    Partners: Male    Birth control/ protection: Surgical     Comment: husband   Other Topics Concern  . Not on file   Social History Narrative   Married to [place]. 2 children [name] and [place].    SAHM. Post grad education.    Nonsmoker.    Drinks caffeine, takes a daily vitamin   Wears seatbelt, bicycle helmet, exercises routinely.    Smoke detector in the home.    Allergies as of 12/22/2016   No Known Allergies     Medication List       Accurate as of 12/22/16 10:31 AM. Always use your most recent med list.          escitalopram 20 MG tablet Commonly known as:  LEXAPRO 1 tab for 1 week, then 2 tabs daily.   fexofenadine 180 MG tablet Commonly known as:  ALLEGRA ALLERGY Take 1 tablet (180 mg total) by mouth daily.   hydrOXYzine 25 MG capsule Commonly known as:  VISTARIL Take 1-2 capsules (25-50 mg total) by mouth at bedtime as needed.   ibuprofen 600 MG tablet Commonly known as:  ADVIL,MOTRIN Take 600 mg by mouth.   methimazole 5 MG tablet Commonly known as:  TAPAZOLE Take 1 tablet (5 mg total) by mouth daily.   multivitamin tablet Take by mouth.   ranitidine 150 MG tablet Commonly known as:  ZANTAC Take 1 tablet (150 mg total) by mouth 2 (two) times daily.   RELPAX 20 MG tablet Generic drug:  eletriptan TAKE ONE TABLET BY MOUTH  AT ONSET OF HEADACHE AND MAY REPEAT IN 2 HOURS IF NEEDED   triamcinolone cream 0.1 % Commonly known as:  KENALOG Apply topically.   Vitamin D-3 1000 units Caps Take 1 tablet by mouth.        Recent Results (from the past 2160 hour(s))  TSH     Status: None   Collection Time:  10/06/16 11:04 AM  Result Value Ref Range   TSH 2.54 0.35 - 4.50 uIU/mL  T3     Status: None   Collection Time: 10/06/16 11:04 AM  Result Value Ref Range   T3, Total 103.0 76 - 181 ng/dL  T4, free     Status: None   Collection Time: 10/06/16 11:04 AM  Result Value Ref Range   Free T4 1.00 0.60 - 1.60 ng/dL    Comment: Specimens from patients who are undergoing biotin therapy and /or ingesting biotin supplements may contain high levels of biotin.  The higher biotin concentration in these specimens interferes with this Free T4 assay.  Specimens that contain high levels  of biotin may cause false high results for this Free T4 assay.  Please interpret results in light of the total clinical presentation of the patient.       ROS: 14 pt review of systems performed and negative (unless mentioned in an HPI)  Objective: BP 93/62 (BP Location: Left Arm, Patient Position: Sitting, Cuff Size: Normal)   Pulse 68   Temp 98.2 F (36.8 C)   Resp 20   Ht 5' (1.524 m)   Wt 114 lb 8 oz (51.9 kg)   SpO2 99%   BMI 22.36 kg/m   Gen: Afebrile. No acute distress.  Skin: Urticaria present lower ext, no  purpura or petechiae.  Neuro: Normal gait. PERLA. EOMi. Alert. Oriented x3  Psych: Mildly anxious, otherwise Normal affect, dress and demeanor. Normal speech. Normal thought content and judgment..   Assessment/plan: Zelpha D Chancellor is a 41 y.o. female present for follow up on hives Urticaria/anxiety: - again (12/22/2016), discussed in length the nature of chronic hives.She still seems to have difficulty understanding the potential chronicity and multifactorial causes of chronic hives.  - Pt has declined Derm referral.  -  Allergist appt next week. Advised her to call allergist office for guidance on medication management if she has facial swelling over the next week, since they do not want her taking her antihistamines.  - refill on lexapro 20 mg QD (6-mos) - Thyroid levels were normal at establishment in November. Will repeat today. TSH/T3/T4 free - continue allegra, zantac,  and vistaril at night (except the 7 days allergy asked her to hold).  - pt Again urged to go to ED if hives affect mouth, lips or any shortness of breath.  - F/U 6 months anxiety, sooner if needed   Return in about 6 months (around 06/21/2017), or anxiety.   Electronically signed by: Felix Pacini, DO [facility] [place]

## 2016-12-22 NOTE — Telephone Encounter (Signed)
Order clarified

## 2016-12-22 NOTE — Telephone Encounter (Signed)
Tech calling about Lexapro 20 mg, needs clarification on dosage directions. It indicates that the patients should take 40 mg which is too high.   Please call the pharmacy.  Thank you,  -LL

## 2016-12-23 ENCOUNTER — Telehealth: Payer: Self-pay | Admitting: Family Medicine

## 2016-12-23 LAB — T3: T3, Total: 89.9 ng/dL (ref 76–181)

## 2016-12-23 NOTE — Telephone Encounter (Signed)
Please call Amy Delacruz: - her thyroid studies are normal.

## 2016-12-23 NOTE — Telephone Encounter (Signed)
Spoke with patient reviewed lab results. 

## 2017-01-07 ENCOUNTER — Encounter: Payer: Self-pay | Admitting: Family Medicine

## 2017-01-12 ENCOUNTER — Other Ambulatory Visit: Payer: Self-pay | Admitting: Family Medicine

## 2017-01-12 DIAGNOSIS — E0500 Thyrotoxicosis with diffuse goiter without thyrotoxic crisis or storm: Secondary | ICD-10-CM

## 2017-01-12 DIAGNOSIS — L509 Urticaria, unspecified: Secondary | ICD-10-CM

## 2017-01-13 ENCOUNTER — Other Ambulatory Visit: Payer: Self-pay | Admitting: Family Medicine

## 2017-02-02 ENCOUNTER — Encounter: Payer: Self-pay | Admitting: Family Medicine

## 2017-02-02 ENCOUNTER — Encounter: Payer: Self-pay | Admitting: *Deleted

## 2017-02-02 ENCOUNTER — Telehealth: Payer: Self-pay | Admitting: Family Medicine

## 2017-02-02 NOTE — Telephone Encounter (Signed)
Pt would like taper off lexapro by [employer] I would like her to be reminded that she is also taking lexapro for anxiety and if she is feeling "unemotional" on current dose she may want to consider the lower dose before quitting altogether. This is her decision of course. She can half the medication/pill  to lexapro 10 mg for 1 week, then take 1/2 tab every other day for 3 doses, then stop medicine. Or She could try lexapro 10 mg (1/2 tab) a day for a few weeks to see if it helps with anxiety, but does not cause her to be "unemotional".   Whatever she decides is ok.

## 2017-02-02 NOTE — Telephone Encounter (Signed)
Spoke with patient reviewed information and sent it in My Chart.

## 2017-02-03 ENCOUNTER — Ambulatory Visit: Payer: 59 | Admitting: Family Medicine

## 2017-03-27 ENCOUNTER — Other Ambulatory Visit: Payer: Self-pay | Admitting: Family Medicine

## 2017-03-27 DIAGNOSIS — L509 Urticaria, unspecified: Secondary | ICD-10-CM

## 2017-03-27 DIAGNOSIS — E0500 Thyrotoxicosis with diffuse goiter without thyrotoxic crisis or storm: Secondary | ICD-10-CM

## 2017-03-30 ENCOUNTER — Other Ambulatory Visit (HOSPITAL_COMMUNITY): Payer: Self-pay | Admitting: Obstetrics & Gynecology

## 2017-03-30 DIAGNOSIS — Z1239 Encounter for other screening for malignant neoplasm of breast: Secondary | ICD-10-CM

## 2017-04-06 ENCOUNTER — Encounter: Payer: Self-pay | Admitting: Family Medicine

## 2017-04-06 ENCOUNTER — Ambulatory Visit (INDEPENDENT_AMBULATORY_CARE_PROVIDER_SITE_OTHER): Payer: 59 | Admitting: Family Medicine

## 2017-04-06 VITALS — BP 100/69 | HR 87 | Temp 98.0°F | Resp 20 | Wt 115.2 lb

## 2017-04-06 DIAGNOSIS — J029 Acute pharyngitis, unspecified: Secondary | ICD-10-CM | POA: Diagnosis not present

## 2017-04-06 DIAGNOSIS — G4763 Sleep related bruxism: Secondary | ICD-10-CM | POA: Insufficient documentation

## 2017-04-06 MED ORDER — AZITHROMYCIN 250 MG PO TABS: ORAL_TABLET | ORAL | 0 refills | Status: DC

## 2017-04-06 NOTE — Progress Notes (Signed)
Amy Delacruz , 02/10/1976, 41 y.o., female MRN: [id number] Patient Care Team    Relationship Specialty Notifications Start End  Natalia LeatherwoodKuneff, Sherrick Araki A, DO PCP - General Family Medicine  10/06/16   Warden FillersLambeth, John, MD    10/07/16    Comment: endocrine  Jorizzo, Tandy GawJoseph L, MD Referring Physician Dermatology  10/07/16   Allie Bossierove, Myra C, MD Consulting Physician Obstetrics and Gynecology  10/07/16     Chief Complaint  Patient presents with  . Otitis Media    jaw pain, throat pain, headache     Subjective: Pt presents for an OV with complaints of ear pain, throat pain, jaw pain, neck pain. Pt reports her symptoms started about 2 weeks with mild throat pain. She has stopped all her allergy medications and her lexapro.  Yesterday she woke up with jaw pain, headache and throat was sore again. She denies fever, chills, nausea, vomit, diarrhea, rash. She is eating and drink well. She is under more stress with the house remodel/building again, her dog passed away this week and her mother is moving back to town. She has started back the lexapro 3 days ago and xyzal start about 2 days ago at half dose. No other members of the house are ill.    [facility] 10/06/2016 10/06/2016  Decreased Interest 1 0  Down, Depressed, Hopeless 0 0  PHQ - 2 Score 1 0    No Known Allergies Social History  Substance Use Topics  . Smoking status: Never Smoker  . Smokeless tobacco: Never Used  . Alcohol use 2.4 oz/week    4 Cans of beer per week   Past Medical History:  Diagnosis Date  . Abnormal Pap smear of cervix 2007  . Allergy   . Depression with anxiety 2007  . GERD (gastroesophageal reflux disease)   . Graves disease   . Graves' ophthalmopathy    follows with ophthalmology  . History of positive PPD    cxr negative  . Lupus erythematosus    managed by dermatology   . Migraine   . Rosacea   . Scoliosis   . Seasonal allergies   . Urticaria 08/2016  . Varicella   . Vitamin D deficiency     Past Surgical History:  Procedure Laterality Date  . DILATION AND CURETTAGE OF UTERUS  2004   2004,2007  . SKIN LESION EXCISION    . TONSILLECTOMY  1978  . WISDOM TOOTH EXTRACTION  2000   Family History  Problem Relation Age of Onset  . Breast cancer Paternal Grandmother [id number]  . Hypertension Mother   . Lung disease Mother   . Hypertension Father   . Bleeding Disorder Father   . Stroke Maternal Grandmother   . Hypertension Maternal Grandmother   . Depression Sister   . Depression Maternal Aunt    Allergies as of 04/06/2017   No Known Allergies     Medication List       Accurate as of 04/06/17  1:43 PM. Always use your most recent med list.          EPINEPHrine 0.3 mg/0.3 mL Soaj injection Commonly known as:  EPI-PEN   escitalopram 20 MG tablet Commonly known as:  LEXAPRO Take 1 tablet (20 mg total) by mouth daily.   ibuprofen 600 MG tablet Commonly known as:  ADVIL,MOTRIN Take 600 mg by mouth.   levocetirizine 5 MG tablet Commonly known as:  XYZAL Take 1 tablet by mouth daily.   multivitamin tablet  Take by mouth.   ranitidine 150 MG tablet Commonly known as:  ZANTAC TAKE ONE TABLET BY MOUTH 2 TIMES A DAY   RELPAX 20 MG tablet Generic drug:  eletriptan TAKE ONE TABLET BY MOUTH AT ONSET OF HEADACHE AND MAY REPEAT IN 2 HOURS IF NEEDED       All past medical history, surgical history, allergies, family history, immunizations andmedications were updated in the EMR today and reviewed under the history and medication portions of their EMR.     ROS: Negative, with the exception of above mentioned in HPI   Objective:  BP 100/69 (BP Location: Left Arm, Patient Position: Sitting, Cuff Size: Normal)   Pulse 87   Temp 98 F (36.7 C)   Resp 20   Wt 115 lb 4 oz (52.3 kg)   LMP 03/28/2017   SpO2 97%   BMI 22.51 kg/m  Body mass index is 22.51 kg/m. Gen: Afebrile. No acute distress. Nontoxic in appearance, well developed, well nourished. Looks well.  HENT:  AT. Page. Bilateral TM visualized without erythema, or fullness. MMM, no oral lesions. Bilateral nares with erythema or swelling, mild drainage. Throat without erythema or exudates. Mild PND present. No hoarseness or cough.  Eyes:Pupils Equal Round Reactive to light, Extraocular movements intact,  Conjunctiva without redness, discharge or icterus. Neck/lymp/endocrine: Supple,mild left ant cervical lymphadenopathy CV: RRR  Chest: CTAB, no wheeze or crackles. Good air movement, normal resp effort.  Abd: Soft. NTND. BS present.  Skin: no rashes, purpura or petechiae.  Neuro:  Normal gait. PERLA. EOMi. Alert. Oriented x3   No exam data present No results found. No results found for this or any previous visit (from the past 24 hour(s)).  Assessment/Plan: Amy Delacruz is a 41 y.o. female present for OV for  Viral pharyngitis If infectious at all, would be viral. Suspect symptoms are allergy related.  Rest, hydrate. Mucinex DM and OTC treatment.  Z-pack printed script provided, for pt to use only if worsening after Thursday. In the meantime restart full dose xyzal and use mucinex.   Bruxism, sleep-related Area of discomfort appears more TMJ/jaw related.  Discussed stress, grnding teeth as potential cause, and she does admit to grinding teeth at night at times. She is under more stress. She has been on lexapro for only 3 days.  NSaids. Massage, relax, lexapro, heating pad. If no improvement could consider muscle relaxer.  - F/U PRN  Reviewed expectations re: course of current medical issues.  Discussed self-management of symptoms.  Outlined signs and symptoms indicating need for more acute intervention.  Patient verbalized understanding and all questions were answered.  Patient received an After-Visit Summary.   Note is dictated utilizing voice recognition software. Although note has been proof read prior to signing, occasional typographical errors still can be missed. If any questions  arise, please do not hesitate to call for verification.   electronically signed by:  Felix Pacini, DO  [facility] [place]

## 2017-04-06 NOTE — Patient Instructions (Addendum)
Z- pack if needed on Thursday to start.  Mucinex for increase in phlegm/sore throat. If you are infectious, it is likely viral and antibiotic would not be helpful at this time.  Restart the xyzal.  Use a heating pad on your neck.   If you notice you are clenching your teeth or increase in jaw/neck pain, then be seen we can try a muscle relaxer to help.    Temporomandibular Joint Syndrome Temporomandibular joint (TMJ) syndrome is a condition that affects the joints between your jaw and your skull. The TMJs are located near your ears and allow your jaw to open and close. These joints and the nearby muscles are involved in all movements of the jaw. People with TMJ syndrome have pain in the area of these joints and muscles. Chewing, biting, or other movements of the jaw can be difficult or painful. TMJ syndrome can be caused by various things. In many cases, the condition is mild and goes away within a few weeks. For some people, the condition can become a long-term problem. What are the causes? Possible causes of TMJ syndrome include:  Grinding your teeth or clenching your jaw. Some people do this when they are under stress.  Arthritis.  Injury to the jaw.  Head or neck injury.  Teeth or dentures that are not aligned well.  In some cases, the cause of TMJ syndrome may not be known. What are the signs or symptoms? The most common symptom is an aching pain on the side of the head in the area of the TMJ. Other symptoms may include:  Pain when moving your jaw, such as when chewing or biting.  Being unable to open your jaw all the way.  Making a clicking sound when you open your mouth.  Headache.  Earache.  Neck or shoulder pain.  How is this diagnosed? Diagnosis can usually be made based on your symptoms, your medical history, and a physical exam. Your health care provider may check the range of motion of your jaw. Imaging tests, such as X-rays or an MRI, are sometimes done. You  may need to see your dentist to determine if your teeth and jaw are lined up correctly. How is this treated? TMJ syndrome often goes away on its own. If treatment is needed, the options may include:  Eating soft foods and applying ice or heat.  Medicines to relieve pain or inflammation.  Medicines to relax the muscles.  A splint, bite plate, or mouthpiece to prevent teeth grinding or jaw clenching.  Relaxation techniques or counseling to help reduce stress.  Transcutaneous electrical nerve stimulation (TENS). This helps to relieve pain by applying an electrical current through the skin.  Acupuncture. This is sometimes helpful to relieve pain.  Jaw surgery. This is rarely needed.  Follow these instructions at home:  Take medicines only as directed by your health care provider.  Eat a soft diet if you are having trouble chewing.  Apply ice to the painful area. ? Put ice in a plastic bag. ? Place a towel between your skin and the bag. ? Leave the ice on for 20 minutes, 2-3 times a day.  Apply a warm compress to the painful area as directed.  Massage your jaw area and perform any jaw stretching exercises as recommended by your health care provider.  If you were given a mouthpiece or bite plate, wear it as directed.  Avoid foods that require a lot of chewing. Do not chew gum.  Keep all  follow-up visits as directed by your health care provider. This is important. Contact a health care provider if:  You are having trouble eating.  You have new or worsening symptoms. Get help right away if:  Your jaw locks open or closed. This information is not intended to replace advice given to you by your health care provider. Make sure you discuss any questions you have with your health care provider. Document Released: 07/07/2001 Document Revised: 06/11/2016 Document Reviewed: 05/17/2014 Elsevier Interactive Patient Education  [employer].

## 2017-04-27 ENCOUNTER — Ambulatory Visit: Payer: 59 | Admitting: Obstetrics & Gynecology

## 2017-05-12 ENCOUNTER — Ambulatory Visit: Payer: 59 | Admitting: Obstetrics & Gynecology

## 2017-05-18 ENCOUNTER — Encounter: Payer: 59 | Admitting: Family Medicine

## 2017-05-19 ENCOUNTER — Encounter: Payer: Self-pay | Admitting: Family Medicine

## 2017-05-19 ENCOUNTER — Encounter: Payer: 59 | Admitting: Family Medicine

## 2017-05-21 ENCOUNTER — Telehealth: Payer: Self-pay | Admitting: Family Medicine

## 2017-05-21 ENCOUNTER — Encounter: Payer: Self-pay | Admitting: *Deleted

## 2017-05-21 NOTE — Telephone Encounter (Signed)
Sent information to patient in MY Chart 

## 2017-05-21 NOTE — Telephone Encounter (Signed)
She can half the medication/pill  to lexapro 10 mg for 1 week, then take 1/2 tab every other day for 3 doses, then stop medicine.

## 2017-06-07 ENCOUNTER — Encounter: Payer: Self-pay | Admitting: Obstetrics & Gynecology

## 2017-06-07 ENCOUNTER — Ambulatory Visit (INDEPENDENT_AMBULATORY_CARE_PROVIDER_SITE_OTHER): Payer: 59 | Admitting: Obstetrics & Gynecology

## 2017-06-07 VITALS — BP 130/60 | HR 100 | Ht 60.0 in | Wt 115.0 lb

## 2017-06-07 DIAGNOSIS — Z01419 Encounter for gynecological examination (general) (routine) without abnormal findings: Secondary | ICD-10-CM

## 2017-06-07 DIAGNOSIS — Z1151 Encounter for screening for human papillomavirus (HPV): Secondary | ICD-10-CM | POA: Diagnosis not present

## 2017-06-07 DIAGNOSIS — Z124 Encounter for screening for malignant neoplasm of cervix: Secondary | ICD-10-CM | POA: Diagnosis not present

## 2017-06-07 NOTE — Progress Notes (Signed)
Subjective:    Amy Delacruz is a 41 y.o. MW p (5211 and 43 yo daughters)female who presents for an annual exam. The patient has no complaints today. The patient is sexually active. GYN screening history: last pap: was normal. The patient wears seatbelts: yes. The patient participates in regular exercise: yes. Has the patient ever been transfused or tattooed?: no. The patient reports that there is not domestic violence in her life.   Menstrual History: OB History    Gravida Para Term Preterm AB Living   2 2 2     2    SAB TAB Ectopic Multiple Live Births           2       Patient's last menstrual period was 05/17/2017.    The following portions of the patient's history were reviewed and updated as appropriate: allergies, current medications, past family history, past medical history, past social history, past surgical history and problem list.  Review of Systems Pertinent items are noted in HPI.   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 Married for 20 years Buying and selling a house, lots of stress Mammogram scheduled   Objective:    BP 130/60 (BP Location: Right Arm)   Pulse 100   Ht 5' (1.524 m)   Wt 115 lb (52.2 kg)   LMP 05/17/2017   BMI 22.46 kg/m   General Appearance:    Alert, cooperative, no distress, appears stated age  Head:    Normocephalic, without obvious abnormality, atraumatic  Eyes:    PERRL, conjunctiva/corneas clear, EOM's intact, fundi    benign, both eyes  Ears:    Normal TM's and external ear canals, both ears  Nose:   Nares normal, septum midline, mucosa normal, no drainage    or sinus tenderness  Throat:   Lips, mucosa, and tongue normal; teeth and gums normal  Neck:   Supple, symmetrical, trachea midline, no adenopathy;    thyroid:  no enlargement/tenderness/nodules; no carotid   bruit or JVD  Back:     Symmetric, no curvature, ROM normal, no CVA tenderness  Lungs:     Clear to auscultation bilaterally, respirations unlabored  Chest  Wall:    No tenderness or deformity   Heart:    Regular rate and rhythm, S1 and S2 normal, no murmur, rub   or gallop  Breast Exam:    No tenderness, masses, or nipple abnormality  Abdomen:     Soft, non-tender, bowel sounds active all four quadrants,    no masses, no organomegaly  Genitalia:    Normal female without lesion, discharge or tenderness     Extremities:   Extremities normal, atraumatic, no cyanosis or edema  Pulses:   2+ and symmetric all extremities  Skin:   Skin color, texture, turgor normal, no rashes or lesions  Lymph nodes:   Cervical, supraclavicular, and axillary nodes normal  Neurologic:   CNII-XII intact, normal strength, sensation and reflexes    throughout  .    Assessment:    Healthy female exam.    Plan:     Thin prep Pap smear.

## 2017-06-10 ENCOUNTER — Encounter: Payer: 59 | Admitting: Family Medicine

## 2017-06-10 LAB — CYTOLOGY - PAP
Diagnosis: NEGATIVE
HPV: NOT DETECTED

## 2017-06-23 ENCOUNTER — Encounter: Payer: Self-pay | Admitting: Family Medicine

## 2017-06-23 ENCOUNTER — Ambulatory Visit: Payer: 59

## 2017-06-23 ENCOUNTER — Telehealth: Payer: Self-pay | Admitting: Family Medicine

## 2017-06-23 ENCOUNTER — Ambulatory Visit (INDEPENDENT_AMBULATORY_CARE_PROVIDER_SITE_OTHER): Payer: 59 | Admitting: Family Medicine

## 2017-06-23 VITALS — BP 95/62 | HR 95 | Temp 98.3°F | Resp 20 | Ht 60.0 in | Wt 116.5 lb

## 2017-06-23 DIAGNOSIS — Z13220 Encounter for screening for lipoid disorders: Secondary | ICD-10-CM

## 2017-06-23 DIAGNOSIS — E559 Vitamin D deficiency, unspecified: Secondary | ICD-10-CM | POA: Insufficient documentation

## 2017-06-23 DIAGNOSIS — Z0000 Encounter for general adult medical examination without abnormal findings: Secondary | ICD-10-CM

## 2017-06-23 DIAGNOSIS — E0500 Thyrotoxicosis with diffuse goiter without thyrotoxic crisis or storm: Secondary | ICD-10-CM | POA: Diagnosis not present

## 2017-06-23 DIAGNOSIS — L930 Discoid lupus erythematosus: Secondary | ICD-10-CM | POA: Diagnosis not present

## 2017-06-23 DIAGNOSIS — Z131 Encounter for screening for diabetes mellitus: Secondary | ICD-10-CM

## 2017-06-23 DIAGNOSIS — Z130 Encounter for screening for diseases of the blood and blood-forming organs and certain disorders involving the immune mechanism: Secondary | ICD-10-CM | POA: Diagnosis not present

## 2017-06-23 LAB — VITAMIN D 25 HYDROXY (VIT D DEFICIENCY, FRACTURES): VITD: 34.64 ng/mL (ref 30.00–100.00)

## 2017-06-23 LAB — COMPREHENSIVE METABOLIC PANEL
ALT: 15 U/L (ref 0–35)
AST: 20 U/L (ref 0–37)
Albumin: 4.2 g/dL (ref 3.5–5.2)
Alkaline Phosphatase: 37 U/L — ABNORMAL LOW (ref 39–117)
BUN: 15 mg/dL (ref 6–23)
CO2: 31 mEq/L (ref 19–32)
Calcium: 9.2 mg/dL (ref 8.4–10.5)
Chloride: 103 mEq/L (ref 96–112)
Creatinine, Ser: 0.68 mg/dL (ref 0.40–1.20)
GFR: 100.46 mL/min (ref >60.00)
Glucose, Bld: 77 mg/dL (ref 70–99)
Potassium: 3.9 mEq/L (ref 3.5–5.1)
Sodium: 138 mEq/L (ref 135–145)
Total Bilirubin: 0.7 mg/dL (ref 0.2–1.2)
Total Protein: 6.5 g/dL (ref 6.0–8.3)

## 2017-06-23 LAB — LIPID PANEL
Cholesterol: 171 mg/dL (ref 0–200)
HDL: 68.6 mg/dL (ref >39.00)
LDL Cholesterol: 92 mg/dL (ref 0–99)
NonHDL: 102.66
Total CHOL/HDL Ratio: 2
Triglycerides: 53 mg/dL (ref 0.0–149.0)
VLDL: 10.6 mg/dL (ref 0.0–40.0)

## 2017-06-23 LAB — CBC WITH DIFFERENTIAL/PLATELET
Basophils Absolute: 0 10*3/uL (ref 0.0–0.1)
Basophils Relative: 0.4 % (ref 0.0–3.0)
Eosinophils Absolute: 0 10*3/uL (ref 0.0–0.7)
Eosinophils Relative: 0.1 % (ref 0.0–5.0)
HCT: 43.6 % (ref 36.0–46.0)
Hemoglobin: 14.7 g/dL (ref 12.0–15.0)
Lymphocytes Relative: 22.6 % (ref 12.0–46.0)
Lymphs Abs: 1.6 10*3/uL (ref 0.7–4.0)
MCHC: 33.8 g/dL (ref 30.0–36.0)
MCV: 94 fl (ref 78.0–100.0)
Monocytes Absolute: 0.4 10*3/uL (ref 0.1–1.0)
Monocytes Relative: 5.4 % (ref 3.0–12.0)
Neutro Abs: 5 10*3/uL (ref 1.4–7.7)
Neutrophils Relative %: 71.5 % (ref 43.0–77.0)
Platelets: 235 10*3/uL (ref 150.0–400.0)
RBC: 4.64 Mil/uL (ref 3.87–5.11)
RDW: 12.8 % (ref 11.5–15.5)
WBC: 7 10*3/uL (ref 4.0–10.5)

## 2017-06-23 LAB — T4, FREE: Free T4: 1.02 ng/dL (ref 0.60–1.60)

## 2017-06-23 LAB — T3, FREE: T3, Free: 3.6 pg/mL (ref 2.3–4.2)

## 2017-06-23 LAB — TSH: TSH: 1.56 u[IU]/mL (ref 0.35–4.50)

## 2017-06-23 LAB — HEMOGLOBIN A1C: Hgb A1c MFr Bld: 5 % (ref 4.6–6.5)

## 2017-06-23 NOTE — Telephone Encounter (Signed)
Patient states she was seen in office this morning and is now home reviewing her after visit summary(AVS).  She noticed that pcp ordered CMET and she needs clarification as to why this was ordered.  She is assuming it is to test her for lupus.  However, pt reports her dermatologist already tested her for this a few years ago and advised she has it, although she is not currently being treated.  If this is the reason the test was ordered she would like to have it cancelled as she does not want to pay for it.  She is requesting call back to discuss as she was not aware this test was being ordered during ov today.

## 2017-06-23 NOTE — Progress Notes (Signed)
Patient ID: Amy Delacruz, female  DOB: [birth year], 41 y.o.   MRN: [id number] Patient Care Team    Relationship Specialty Notifications Start End  Ma Hillock, DO PCP - General Family Medicine  10/06/16   Susette Racer, MD    10/07/16    Comment: endocrine  Jorizzo, Elwyn Lade, MD Referring Physician Dermatology  10/07/16   Emily Filbert, MD Consulting Physician Obstetrics and Gynecology  10/07/16     Chief Complaint  Patient presents with  . Annual Exam    Subjective:  Amy Delacruz is a 41 y.o.  Female  present for CPE. All past medical history, surgical history, allergies, family history, immunizations, medications and social history were updated in the electronic medical record today. All recent labs, ED visits and hospitalizations within the last year were reviewed.  Health maintenance:  Colonoscopy: No fx, screen at 48 Mammogram: completed:05/20/2016, birads 0, [place] completed and was normal, routinely yearly recommended. Scheduled next week.   Cervical cancer screening: last pap: 06/07/2017, results: normal, completed by: Dr. Hulan Fray Immunizations: tdap UTD 12/2014, Influenza declined today (encouraged yearly) Infectious disease screening: HIV completed DEXA: N/A Assistive device: none Oxygen JGO:TLXB Patient has a Dental home. Hospitalizations/ED visits: reviewed   Depression screen [facility] 2/9 06/23/2017 10/06/2016 10/06/2016  Decreased Interest - 1 0  Down, Depressed, Hopeless 1 0 0  PHQ - 2 Score 1 1 0  Altered sleeping 0 - -  Tired, decreased energy 1 - -  Change in appetite 0 - -  Feeling bad or failure about yourself  0 - -  Trouble concentrating 0 - -  Moving slowly or fidgety/restless 1 - -  Suicidal thoughts 0 - -  PHQ-9 Score 3 - -   GAD 7 : Generalized Anxiety Score 10/06/2016  Nervous, Anxious, on Edge 3  Control/stop worrying 3  Worry too much - different things 3  Trouble relaxing 0  Restless 0  Easily annoyed or irritable 2  Afraid - awful might  happen 2  Total GAD 7 Score 13  Anxiety Difficulty Not difficult at all     Current Exercise Habits: Home exercise routine, Type of exercise: walking;strength training/weights, Time (Minutes): 60, Frequency (Times/Week): 7, Weekly Exercise (Minutes/Week): 420, Intensity: Moderate     Immunization History  Administered Date(s) Administered  . Influenza-Unspecified 06/27/2015, 06/26/2016  . Tdap 12/05/2010, 01/02/2015     Past Medical History:  Diagnosis Date  . Abnormal Pap smear of cervix 2007  . Allergy   . Depression with anxiety 2007  . GERD (gastroesophageal reflux disease)   . Graves disease   . Graves' ophthalmopathy    follows with ophthalmology  . History of positive PPD    cxr negative  . Lupus erythematosus    managed by dermatology   . Migraine   . Rosacea   . Scoliosis   . Seasonal allergies   . Urticaria 08/2016  . Varicella   . Vitamin D deficiency    No Known Allergies Past Surgical History:  Procedure Laterality Date  . DILATION AND CURETTAGE OF UTERUS  2004   2004,2007  . SKIN LESION EXCISION    . TONSILLECTOMY  1978  . WISDOM TOOTH EXTRACTION  2000   Family History  Problem Relation Age of Onset  . Breast cancer Paternal Grandmother 33  . Hypertension Mother   . Lung disease Mother   . Hypertension Father   . Bleeding Disorder Father   . Stroke Maternal Grandmother   .  Hypertension Maternal Grandmother   . Depression Sister   . Depression Maternal Aunt    Social History   Social History  . Marital status: Married    Spouse name: Gershon Mussel  . Number of children: 2  . Years of education: Post grad   Occupational History  . stay at home mother    Social History Main Topics  . Smoking status: Never Smoker  . Smokeless tobacco: Never Used  . Alcohol use 2.4 oz/week    4 Cans of beer per week  . Drug use: No  . Sexual activity: Yes    Partners: Male    Birth control/ protection: Surgical     Comment: husband   Other Topics Concern   . Not on file   Social History Narrative   Married to [place]. 2 children Joelene Millin and [place].    SAHM. Post grad education.    Nonsmoker.    Drinks caffeine, takes a daily vitamin   Wears seatbelt, bicycle helmet, exercises routinely.    Smoke detector in the home.    Allergies as of 06/23/2017   No Known Allergies     Medication List       Accurate as of 06/23/17 11:27 AM. Always use your most recent med list.          EPINEPHrine 0.3 mg/0.3 mL Soaj injection Commonly known as:  EPI-PEN   ibuprofen 600 MG tablet Commonly known as:  ADVIL,MOTRIN Take 600 mg by mouth.   levocetirizine 5 MG tablet Commonly known as:  XYZAL Take 1 tablet by mouth daily as needed.   multivitamin tablet Take by mouth.   RELPAX 20 MG tablet Generic drug:  eletriptan TAKE ONE TABLET BY MOUTH AT ONSET OF HEADACHE AND MAY REPEAT IN 2 HOURS IF NEEDED            Discharge Care Instructions        Start     Ordered   06/23/17 0000  CBC w/Diff     06/23/17 1112   06/23/17 0000  Comp Met (CMET)     06/23/17 1112   06/23/17 0000  TSH     06/23/17 1112   06/23/17 0000  Lipid panel     06/23/17 1112   06/23/17 0000  HgB A1c     06/23/17 1112   06/23/17 0000  T3, free     06/23/17 1112   06/23/17 0000  T4, free     06/23/17 1112   06/23/17 0000  Vitamin D (25 hydroxy)     06/23/17 1126      All past medical history, surgical history, allergies, family history, immunizations andmedications were updated in the EMR today and reviewed under the history and medication portions of their EMR.     Recent Results (from the past 2160 hour(s))  Cytology - PAP     Status: None   Collection Time: 06/07/17 12:00 AM  Result Value Ref Range   Adequacy      Satisfactory for evaluation  endocervical/transformation zone component PRESENT.   Diagnosis      NEGATIVE FOR INTRAEPITHELIAL LESIONS OR MALIGNANCY.   Diagnosis      FUNGAL ORGANISMS PRESENT CONSISTENT WITH CANDIDA SPP.   HPV NOT  DETECTED     Comment: Normal Reference Range - NOT Detected   Material Submitted CervicoVaginal Pap [ThinPrep Imaged]    CYTOLOGY - PAP PAP RESULT     US Breast Ltd Uni Left Inc Axilla  Result Date: 05/26/2016 CLINICAL  DATA:  Screening recall for left breast asymmetry. EXAM: 2D DIGITAL DIAGNOSTIC UNILATERAL LEFT MAMMOGRAM WITH CAD AND ADJUNCT TOMO LEFT BREAST ULTRASOUND COMPARISON:  Previous exam(s). ACR Breast Density Category c: The breast tissue is heterogeneously dense, which may obscure small masses. FINDINGS: The asymmetry in the lower-inner aspect of the left breast appears to resolve with additional diagnostic images, however, due to the density of breast tissue in this area, ultrasound will be performed for further evaluation. Mammographic images were processed with CAD. Physical exam of the lower-inner aspect of the left breast demonstrates normal fibroglandular tissue without discrete palpable mass. Ultrasound of the lower-inner left breast demonstrates normal fibroglandular tissue. No masses or suspicious areas of shadowing are identified. IMPRESSION: 1. There is resolution of the asymmetry of concern in the lower-inner aspect of the left breast. No mammographic or targeted sonographic evidence of left breast malignancy. RECOMMENDATION: Screening mammogram in one year.(Code:SM-B-01Y) I have discussed the findings and recommendations with the patient. Results were also provided in writing at the conclusion of the visit. If applicable, a reminder letter will be sent to the patient regarding the next appointment. BI-RADS CATEGORY  1: Negative. Electronically Signed   By: Ammie Ferrier M.D.   On: 05/26/2016 14:15   Mm Diag Breast Tomo Uni Left  Result Date: 05/26/2016 CLINICAL DATA:  Screening recall for left breast asymmetry. EXAM: 2D DIGITAL DIAGNOSTIC UNILATERAL LEFT MAMMOGRAM WITH CAD AND ADJUNCT TOMO LEFT BREAST ULTRASOUND COMPARISON:  Previous exam(s). ACR Breast Density Category c: The  breast tissue is heterogeneously dense, which may obscure small masses. FINDINGS: The asymmetry in the lower-inner aspect of the left breast appears to resolve with additional diagnostic images, however, due to the density of breast tissue in this area, ultrasound will be performed for further evaluation. Mammographic images were processed with CAD. Physical exam of the lower-inner aspect of the left breast demonstrates normal fibroglandular tissue without discrete palpable mass. Ultrasound of the lower-inner left breast demonstrates normal fibroglandular tissue. No masses or suspicious areas of shadowing are identified. IMPRESSION: 1. There is resolution of the asymmetry of concern in the lower-inner aspect of the left breast. No mammographic or targeted sonographic evidence of left breast malignancy. RECOMMENDATION: Screening mammogram in one year.(Code:SM-B-01Y) I have discussed the findings and recommendations with the patient. Results were also provided in writing at the conclusion of the visit. If applicable, a reminder letter will be sent to the patient regarding the next appointment. BI-RADS CATEGORY  1: Negative. Electronically Signed   By: Ammie Ferrier M.D.   On: 05/26/2016 14:15     ROS: 14 pt review of systems performed and negative (unless mentioned in an HPI)  Objective: BP 95/62 (BP Location: Right Arm, Patient Position: Sitting, Cuff Size: Normal)   Pulse 95   Temp 98.3 F (36.8 C)   Resp 20   Ht 5' (1.524 m)   Wt 116 lb 8 oz (52.8 kg)   SpO2 99%   BMI 22.75 kg/m  Gen: Afebrile. No acute distress. Nontoxic in appearance, well-developed, well-nourished,  well-developed, pleasant Caucasian female. HENT: AT. [place]. Bilateral TM visualized and normal in appearance, normal external auditory canal. MMM, no oral lesions, adequate dentition. Bilateral nares within normal limits. Throat without erythema, ulcerations or exudates. No Cough on exam, no hoarseness on exam. Eyes:Pupils Equal  Round Reactive to light, Extraocular movements intact,  Conjunctiva without redness, discharge or icterus. Neck/lymp/endocrine: Supple, no lymphadenopathy, no thyromegaly CV: RRR no murmur, no edema, +2/4 P posterior tibialis pulses. No carotid  bruits. No JVD. Chest: CTAB, no wheeze, rhonchi or crackles. Normal Respiratory effort. Good Air movement. Abd: Soft. Flat. NTND. BS present. No Masses palpated. No hepatosplenomegaly. No rebound tenderness or guarding. Skin: No rashes, purpura or petechiae. Warm and well-perfused. Skin intact. Neuro/Msk:  Normal gait. PERLA. EOMi. Alert. Oriented x3.  Cranial nerves II through XII intact. Muscle strength 5/5 upper/lower extremity. DTRs equal bilaterally. Psych: Normal affect, dress and demeanor. Normal speech. Normal thought content and judgment.  No exam data present  Assessment/plan: Amy Delacruz is a 41 y.o. female present for CPE. Encounter for preventive health examination Patient was encouraged to exercise greater than 150 minutes a week. Patient was encouraged to choose a diet filled with fresh fruits and vegetables, and lean meats. AVS provided to patient today for education/recommendation on gender specific health and safety maintenance. Colonoscopy: No fx, screen at 48 Mammogram: completed:05/20/2016, birads 0, [place] completed and was normal, routinely yearly recommended. Scheduled next week.   Cervical cancer screening: last pap: 06/07/2017, results: normal, completed by: Dr. Hulan Fray Immunizations: tdap UTD 12/2014, Influenza declined today (encouraged yearly) Infectious disease screening: HIV completed Graves disease - currently off medication, doing well.  - TSH - T3, free - T4, free Lupus erythematosus, unspecified form - Comp Met (CMET) Screening for deficiency anemia - CBC w/Diff Screening cholesterol level - Lipid panel Diabetes mellitus screening - HgB A1c Vitamin d deficiency:  Prior history. Test Vit D  levels today.   Return  in about 1 year (around 06/23/2018) for CPE.  Electronically signed by: Howard Pouch, DO [place]

## 2017-06-23 NOTE — Patient Instructions (Signed)

## 2017-06-24 NOTE — Telephone Encounter (Signed)
Patient [facility] a [facility] also responded to patient in [facility].

## 2017-06-30 ENCOUNTER — Other Ambulatory Visit (HOSPITAL_COMMUNITY): Payer: Self-pay | Admitting: Obstetrics & Gynecology

## 2017-06-30 ENCOUNTER — Ambulatory Visit (INDEPENDENT_AMBULATORY_CARE_PROVIDER_SITE_OTHER): Payer: 59

## 2017-06-30 DIAGNOSIS — Z1239 Encounter for other screening for malignant neoplasm of breast: Secondary | ICD-10-CM

## 2017-06-30 DIAGNOSIS — Z1231 Encounter for screening mammogram for malignant neoplasm of breast: Secondary | ICD-10-CM

## 2017-09-14 ENCOUNTER — Encounter: Payer: Self-pay | Admitting: Family Medicine

## 2017-11-03 ENCOUNTER — Encounter: Payer: Self-pay | Admitting: Family Medicine

## 2017-11-08 ENCOUNTER — Ambulatory Visit: Payer: 59 | Admitting: Advanced Practice Midwife

## 2017-11-23 ENCOUNTER — Encounter: Payer: Self-pay | Admitting: Family Medicine

## 2017-12-13 ENCOUNTER — Telehealth: Payer: Self-pay | Admitting: Family Medicine

## 2017-12-13 ENCOUNTER — Ambulatory Visit: Payer: 59 | Admitting: Family Medicine

## 2017-12-13 ENCOUNTER — Encounter: Payer: Self-pay | Admitting: Family Medicine

## 2017-12-13 VITALS — BP 111/74 | HR 87 | Temp 98.2°F | Resp 20 | Wt 113.5 lb

## 2017-12-13 DIAGNOSIS — L710 Perioral dermatitis: Secondary | ICD-10-CM | POA: Diagnosis not present

## 2017-12-13 HISTORY — DX: Perioral dermatitis: L71.0

## 2017-12-13 MED ORDER — HYDROCORTISONE 2.5 % EX CREA: TOPICAL_CREAM | Freq: Two times a day (BID) | CUTANEOUS | 0 refills | Status: DC

## 2017-12-13 MED ORDER — METRONIDAZOLE 0.75 % EX LOTN: TOPICAL_LOTION | CUTANEOUS | 0 refills | Status: DC

## 2017-12-13 NOTE — Telephone Encounter (Signed)
Please call patient: I have called in a different cream for her face than we originally spoke about. She has not picked up the other cream as of yet please discontinue hydrocortisone. She can keep the hydrocortisone cream if she has already picked up and use in the event  the metronidazole cream is not effective. If the metronidazole cream is affordable, would prefer to try that First.

## 2017-12-13 NOTE — Patient Instructions (Signed)

## 2017-12-13 NOTE — Progress Notes (Signed)
Amy Delacruz, Amy Delacruz [birth year], 42 y.o., female MRN: [id number] Patient Care Team    Relationship Specialty Notifications Start End  Natalia Leatherwood, DO PCP - General Family Medicine  10/06/16   Warden Fillers, MD    10/07/16    Comment: endocrine  Jorizzo, Tandy Gaw, MD Referring Physician Dermatology  10/07/16   Allie Bossier, MD Consulting Physician Obstetrics and Gynecology  10/07/16     Chief Complaint  Patient presents with  . Rash    face     Subjective: Pt presents for an OV with complaints of facial rash of 3 weeks duration. Patient reports the rash is a fine raised rash that initially started around her lips. Started as  Small white bumps, progressed to fine raised little red bumps surrounding her mouth,  nasolabial fold and right cheek. She reports she had been using the different types of sample size tooth paste from her dentist, but went back to her original hyper toothpaste 2 weeks ago. She also has tried a new face makeup, originally uses bare minerals. She has stopped using all face makeup over the last week.   Depression screen [facility] 2/9 06/23/2017 10/06/2016 10/06/2016  Decreased Interest - 1 0  Down, Depressed, Hopeless 1 0 0  PHQ - 2 Score 1 1 0  Altered sleeping 0 - -  Tired, decreased energy 1 - -  Change in appetite 0 - -  Feeling bad or failure about yourself  0 - -  Trouble concentrating 0 - -  Moving slowly or fidgety/restless 1 - -  Suicidal thoughts 0 - -  PHQ-9 Score 3 - -    No Known Allergies Social History   Tobacco Use  . Smoking status: Never Smoker  . Smokeless tobacco: Never Used  Substance Use Topics  . Alcohol use: Yes    Alcohol/week: 2.4 oz    Types: 4 Cans of beer per week   Past Medical History:  Diagnosis Date  . Abnormal Pap smear of cervix 2007  . Allergy   . Depression with anxiety 2007  . GERD (gastroesophageal reflux disease)   . Graves disease   . Graves' ophthalmopathy    follows with ophthalmology  . History of positive  PPD    cxr negative  . Lupus erythematosus    managed by dermatology   . Migraine   . Rosacea   . Scoliosis   . Seasonal allergies   . Urticaria 08/2016  . Varicella   . Vitamin D deficiency    Past Surgical History:  Procedure Laterality Date  . DILATION AND CURETTAGE OF UTERUS  2004   2004,2007  . SKIN LESION EXCISION    . TONSILLECTOMY  1978  . WISDOM TOOTH EXTRACTION  2000   Family History  Problem Relation Age of Onset  . Breast cancer Paternal Grandmother 62  . Hypertension Mother   . Lung disease Mother   . Hypertension Father   . Bleeding Disorder Father   . Stroke Maternal Grandmother   . Hypertension Maternal Grandmother   . Depression Sister   . Depression Maternal Aunt    Allergies as of 12/13/2017   No Known Allergies     Medication List        Accurate as of 12/13/17  3:02 PM. Always use your most recent med list.          EPINEPHrine 0.3 mg/0.3 mL Soaj injection Commonly known as:  EPI-PEN   ibuprofen 600 MG tablet  Commonly known as:  ADVIL,MOTRIN Take 600 mg by mouth.   levocetirizine 5 MG tablet Commonly known as:  XYZAL Take 1 tablet by mouth daily as needed.   multivitamin tablet Take by mouth.   RELPAX 20 MG tablet Generic drug:  eletriptan TAKE ONE TABLET BY MOUTH AT ONSET OF HEADACHE AND MAY REPEAT IN 2 HOURS IF NEEDED       All past medical history, surgical history, allergies, family history, immunizations andmedications were updated in the EMR today and reviewed under the history and medication portions of their EMR.     ROS: Negative, with the exception of above mentioned in HPI   Objective:  BP 111/74 (BP Location: Right Arm, Patient Position: Sitting, Cuff Size: Normal)   Pulse 87   Temp 98.2 F (36.8 C)   Resp 20   Wt 113 lb 8 oz (51.5 kg)   SpO2 100%   BMI 22.17 kg/m  Body mass index is 22.17 kg/m. Gen: Afebrile. No acute distress. Nontoxic in appearance, well developed, well nourished.  HENT: AT. Amy Delacruz. MMM,  no oral lesions. Eyes:Pupils Equal Round Reactive to light, Extraocular movements intact,  Conjunctiva without redness, discharge or icterus. Skin:fine raised red rash right cheek, right nasolabial fold, right side of mouth and chin . No close or open comedones.  No exam data present No results found. No results found for this or any previous visit (from the past 24 hour(s)).  Assessment/Plan: Amy Delacruz is a [age] y.o. female present for OV for  Perioral dermatitis - Discontinue all makeup products if possible. If need to apply makeup bare minerals which is her original type of makeup. - Returned to her normal toothpaste, advised her to avoid any type of bleeding/bleaching toothpaste for the next few weeks. - Start metronidazole topical lotion, if affordable. Could consider erythromycin ointment. - If needed can use hydrocortisone cream, but this is not as ideal long-term. - hydrocortisone 2.5 % cream; Apply topically 2 (two) times daily.  Dispense: 30 g; Refill: 0 - METRONIDAZOLE, TOPICAL, 0.75 % LOTN; Use thin layer application to affected areas twice daily.  Dispense: 59 mL; Refill: 0 - f/u 4 weeks   Reviewed expectations re: course of current medical issues.  Discussed self-management of symptoms.  Outlined signs and symptoms indicating need for more acute intervention.  Patient verbalized understanding and all questions were answered.  Patient received an After-Visit Summary.   No orders of the defined types were placed in this encounter.  Note is dictated utilizing voice recognition software. Although note has been proof read prior to signing, occasional typographical errors still can be missed. If any questions arise, please do not hesitate to call for verification.   electronically signed by:  Amy Pacinienee Nichole Neyer, DO  [facility] [place]

## 2017-12-13 NOTE — Telephone Encounter (Signed)
Spoke with patient reviewed information patient states she will check on price and see if its affordable.

## 2017-12-20 ENCOUNTER — Ambulatory Visit: Payer: 59 | Admitting: Obstetrics & Gynecology

## 2017-12-20 ENCOUNTER — Encounter: Payer: Self-pay | Admitting: Obstetrics & Gynecology

## 2017-12-20 VITALS — BP 102/60 | HR 111 | Temp 97.3°F | Resp 16 | Ht 60.0 in | Wt 112.0 lb

## 2017-12-20 DIAGNOSIS — R102 Pelvic and perineal pain: Secondary | ICD-10-CM

## 2017-12-20 DIAGNOSIS — R5383 Other fatigue: Secondary | ICD-10-CM

## 2017-12-20 NOTE — Progress Notes (Signed)
Patient ID: Amy ElliotDyan D Delacruz, female   DOB: 02/14/1976, 42 y.o.   MRN: [id number]  Chief Complaint  Patient presents with  . Poss retained tampon    HPI Amy Delacruz is a 42 y.o. female.   HPI This 42 yo married P2 is here because she had an extra "period" last month. Her cycles are generally very regular. She also had "something" come out of her vagina with her last period. She didn't bring it here. She also feels fatigued, She has thyroid diease.  She also describes a "pressure" sensation in her pelvis for "several months".    Past Medical History:  Diagnosis Date  . Abnormal Pap smear of cervix 2007  . Allergy   . Depression with anxiety 2007  . GERD (gastroesophageal reflux disease)   . Graves disease   . Graves' ophthalmopathy    follows with ophthalmology  . History of positive PPD    cxr negative  . Lupus erythematosus    managed by dermatology   . Migraine   . Rosacea   . Scoliosis   . Seasonal allergies   . Urticaria 08/2016  . Varicella   . Vitamin D deficiency     Past Surgical History:  Procedure Laterality Date  . DILATION AND CURETTAGE OF UTERUS  2004   2004,2007  . SKIN LESION EXCISION    . TONSILLECTOMY  1978  . WISDOM TOOTH EXTRACTION  2000    Family History  Problem Relation Age of Onset  . Breast cancer Paternal Grandmother 6550  . Hypertension Mother   . Lung disease Mother   . Hypertension Father   . Bleeding Disorder Father   . Stroke Maternal Grandmother   . Hypertension Maternal Grandmother   . Depression Sister   . Depression Maternal Aunt     Social History Social History   Tobacco Use  . Smoking status: Never Smoker  . Smokeless tobacco: Never Used  Substance Use Topics  . Alcohol use: Yes    Alcohol/week: 2.4 oz    Types: 4 Cans of beer per week  . Drug use: No    No Known Allergies  Current Outpatient Medications  Medication Sig Dispense Refill  . EPINEPHrine 0.3 mg/0.3 mL IJ SOAJ injection     . hydrocortisone 2.5 %  cream Apply topically 2 (two) times daily. 30 g 0  . ibuprofen (ADVIL,MOTRIN) 600 MG tablet Take 600 mg by mouth.    . levocetirizine (XYZAL) 5 MG tablet Take 1 tablet by mouth daily as needed.    . Multiple Vitamin (MULTIVITAMIN) tablet Take by mouth.    . RELPAX 20 MG tablet TAKE ONE TABLET BY MOUTH AT ONSET OF HEADACHE AND MAY REPEAT IN 2 HOURS IF NEEDED 6 tablet 5  . METRONIDAZOLE, TOPICAL, 0.75 % LOTN Use thin layer application to affected areas twice daily. (Patient not taking: Reported on 12/20/2017) 59 mL 0   No current facility-administered medications for this visit.     Review of Systems Review of Systems  Blood pressure 102/60, pulse (!) 111, temperature (!) 97.3 F (36.3 C), resp. rate 16, height 5' (1.524 m), weight 112 lb (50.8 kg), last menstrual period 12/15/2017.  Physical Exam Physical Exam Well nourished, well hydrated White female, no apparent distress Breathing, conversing, and ambulating normally Abd- benign, thin Bimanual exam reveals a uterus that is upper limit of normal, retroverted, minimal mobility Speculum exam reveals no foreign bodies in the uterus Data Reviewed Adequacy Satisfactory for evaluation endocervical/transformation zone component  PRESENT.   Diagnosis NEGATIVE FOR INTRAEPITHELIAL LESIONS OR MALIGNANCY.   Diagnosis FUNGAL ORGANISMS PRESENT CONSISTENT WITH CANDIDA SPP.   HPV NOT DETECTED   Comment: Normal Reference Range - NOT Detected  Material Submitted CervicoVaginal Pap [ThinPrep Imaged]   CYTOLOGY - PAP PAP RESULT      Assessment    Pelvic pressure Irregular period fatigue    Plan    She declines a urinalysis Check gyn ultrasound, CBC and TSH       Carlis Blanchard C Tryston Gilliam 12/20/2017, 1:30 PM

## 2017-12-21 LAB — CBC
HCT: 36.9 % (ref 35.0–45.0)
Hemoglobin: 13.2 g/dL (ref 11.7–15.5)
MCH: 31.6 pg (ref 27.0–33.0)
MCHC: 35.8 g/dL (ref 32.0–36.0)
MCV: 88.3 fL (ref 80.0–100.0)
MPV: 9.4 fL (ref 7.5–12.5)
Platelets: 247 10*3/uL (ref 140–400)
RBC: 4.18 10*6/uL (ref 3.80–5.10)
RDW: 11.5 % (ref 11.0–15.0)
WBC: 6.5 10*3/uL (ref 3.8–10.8)

## 2017-12-21 LAB — TSH: TSH: 0.01 mIU/L — ABNORMAL LOW

## 2017-12-22 ENCOUNTER — Other Ambulatory Visit: Payer: 59

## 2017-12-27 ENCOUNTER — Encounter: Payer: Self-pay | Admitting: Family Medicine

## 2017-12-28 ENCOUNTER — Encounter: Payer: Self-pay | Admitting: Family Medicine

## 2017-12-28 ENCOUNTER — Encounter: Payer: Self-pay | Admitting: *Deleted

## 2017-12-28 ENCOUNTER — Telehealth: Payer: Self-pay | Admitting: Family Medicine

## 2017-12-28 NOTE — Telephone Encounter (Signed)
Please inform pt she should try to not use it longer than 2 weeks if possible. If it keeps returning when not using would  suggest it is an inflammatory response/allergic... It may calm down altogether or it may not. It is really difficult to be certain. If continues can refer to derm for further recs if she desires.  Also, I am uncertain how long she used the metronidazole cream, but it may take a week or so before seeing improvement with that medication.

## 2018-01-11 ENCOUNTER — Encounter: Payer: Self-pay | Admitting: Family Medicine

## 2018-01-12 ENCOUNTER — Telehealth: Payer: Self-pay | Admitting: Family Medicine

## 2018-01-12 NOTE — Telephone Encounter (Signed)
Please contact pts allergist office. - Before answering patient E-message, I would like to get a better understanding on why they ran the ANA, there work up plan and what exactly they recommended to the pt regarding results since she is now calling here asking for recs on her ANA collected at their office. I would like to provide her with a consistent game plan, and [facility] told her to follow here for  recs on that result.  - Of note: I did get results from allergist office. I did not get there recs/plan for pt surrounding her "positive result" which were low and she is known to have a lupus like condition in which she is suppose to follow w/derm.

## 2018-01-13 NOTE — Telephone Encounter (Signed)
I spoke with Dr. Dixie DialsScannel w/[facility], she ran the ANA due patient history of lupus involving skin and also has hives which can be related to autoimmune disease. She has spoken with the patient today and recommended that patient see her dermatologist to discuss this further. If they think she should see a rheumatagolist Dr. Dixie DialsScannel is happy to do the referral.

## 2018-01-14 ENCOUNTER — Encounter: Payer: Self-pay | Admitting: *Deleted

## 2018-01-14 NOTE — Telephone Encounter (Signed)
Amy ChessmanSuzanne. Please inform patient we have communicated with her allergist to make sure we had a clear message on follow up for her and recommendations by both parties is to continue follow up first with dermatology for this condition.

## 2018-01-14 NOTE — Telephone Encounter (Signed)
[facility] .

## 2018-03-24 ENCOUNTER — Telehealth: Payer: Self-pay | Admitting: Family Medicine

## 2018-03-24 NOTE — Telephone Encounter (Signed)
Received a request for Methimazole 5 mg this is no longer listed on patient medication list are we Rx'ing this for patient last script sent by pharmacy indicates It was written on 12/22/16 for 90 tabs with 1 refill and last refill is listed 12/22/27 but directions state one tab daily your note dated 12/22/16 says patient reports she had been taking this medication .Please advise

## 2018-03-24 NOTE — Telephone Encounter (Signed)
Copied from CRM [id number]. Topic: Quick Communication - Rx Refill/Question >> Mar 24, 2018 12:25 PM Mickel Baas B, NT wrote: Medication: methimazole (TAPAZOLE) 5 MG tablet  Has the patient contacted their pharmacy? Yes.   (Agent: If no, request that the patient contact the pharmacy for the refill.) (Agent: If yes, when and what did the pharmacy advise?)  Preferred Pharmacy (with phone number or street name): [id number] [place], [place] - [address]  Agent: Please be advised that RX refills may take up to 3 business days. We ask that you follow-up with your pharmacy.

## 2018-03-25 NOTE — Telephone Encounter (Signed)
Please call pt and clarify medication request.  Lab note by her GYN 12/20/2017 indicated TSH low and for her to call her endocrine to adjust dose of med. The medication should come from endocrine and they should be monitoring her levels since it was abnormal in feb. She may be seeing them, They are not in out system for me to know for sure, but she should call them for refill and make sure to follow up with them.

## 2018-03-25 NOTE — Telephone Encounter (Signed)
Patient states she has a current Rx from Endocrinology and it shouldn't have been sent here.

## 2018-03-29 ENCOUNTER — Other Ambulatory Visit: Payer: Self-pay | Admitting: Family Medicine

## 2018-03-29 DIAGNOSIS — L710 Perioral dermatitis: Secondary | ICD-10-CM

## 2018-03-29 NOTE — Telephone Encounter (Signed)
Make sure patient is requesting this medication and not auto refill please.

## 2018-03-29 NOTE — Telephone Encounter (Signed)
Called patient left message for patient to return call 

## 2018-03-30 NOTE — Telephone Encounter (Signed)
Pt calling back about the medicated lotion she states that she wanted her dermatologist to write RX for her since Dr Claiborne BillingsKuneff didn't want it so discard message the pharmacist made a mistake and sent it to the wrong provider

## 2018-04-15 ENCOUNTER — Other Ambulatory Visit (HOSPITAL_COMMUNITY): Payer: Self-pay | Admitting: Obstetrics & Gynecology

## 2018-04-15 DIAGNOSIS — Z1231 Encounter for screening mammogram for malignant neoplasm of breast: Secondary | ICD-10-CM

## 2018-04-18 ENCOUNTER — Ambulatory Visit (INDEPENDENT_AMBULATORY_CARE_PROVIDER_SITE_OTHER): Payer: 59

## 2018-04-18 DIAGNOSIS — N83201 Unspecified ovarian cyst, right side: Secondary | ICD-10-CM | POA: Diagnosis not present

## 2018-04-18 DIAGNOSIS — R102 Pelvic and perineal pain: Secondary | ICD-10-CM

## 2018-04-19 ENCOUNTER — Encounter: Payer: Self-pay | Admitting: Obstetrics & Gynecology

## 2018-06-01 ENCOUNTER — Ambulatory Visit: Payer: Self-pay | Admitting: *Deleted

## 2018-06-01 NOTE — Telephone Encounter (Signed)
Patient spilled hot taco sauce on her right thumb Monday evening. Includes the area at the base of the nailbed to the 1st knuckle, only dorsal skin involved. Area is red a blister has formed and intact. The nail has not been affected. Denies red streak/pus/fever at this time. Advice care reviewed including leave blister intact, keep clean and dry, triple antibiotic cream to prevent infection. Red streak, swelling or pus to the area are signs to look for and call back to be seen.  Reason for Disposition . Minor thermal burn  Additional Information . Negative: [1] Looks infected (red streaks, spreading red area) AND [2] fever    Denies these symptoms  Answer Assessment - Initial Assessment Questions 1. ONSET: "When did it happen?" If happened < 10 minutes ago, ask: "Did you apply cold water?" If not, give First Aid Advice immediately.      Spilled hot taco sauce on her right thumb two days ago. 2. LOCATION: "Where is the burn located?"      Right thumb beyond the nailbed to the 1st knuckle 3. BURN SIZE: "How large is the burn?"  The palm is roughly 1% of the total body surface area (BSA).     About one inch and includes on the dorsal side of thumb. 4. SEVERITY OF THE BURN: "Are there any blisters?"      One blister intact. 5. MECHANISM: "Tell me how it happened."    Spilled hot taco sauce on finger. 6. PAIN: "Are you having any pain?" "How bad is the pain?" (Scale 1-10; or mild, moderate, severe)   - MILD (1-3): doesn't interfere with normal activities    - MODERATE (4-7): interferes with normal activities or awakens from sleep    - SEVERE (8-10): excruciating pain, unable to do any normal activities      Does not hurt today.Pain yesterday and Monday  7. INHALATION INJURY: "Were you exposed to any smoke or fumes?" If yes: "Do you have any cough or difficulty breathing?"     No at all. 8. OTHER SYMPTOMS: "Do you have any other symptoms?" (e.g., headache, nausea)     No 9. PREGNANCY: "Is  there any chance you are pregnant?" "When was your last menstrual period?"     no  Protocols used: [facility]

## 2018-06-15 LAB — CBC AND DIFFERENTIAL
HCT: 40 (ref 36–46)
Hemoglobin: 13.7 (ref 12.0–16.0)
Platelets: 239 (ref 150–399)
WBC: 6.8

## 2018-06-15 LAB — BASIC METABOLIC PANEL
BUN: 11 (ref 4–21)
Creatinine: 0.7 (ref 0.5–1.1)
Glucose: 97
Potassium: 4.3 (ref 3.4–5.3)
Sodium: 140 (ref 137–147)

## 2018-06-15 LAB — TSH: TSH: 1.39 (ref 0.41–5.90)

## 2018-06-15 LAB — HEPATIC FUNCTION PANEL
ALT: 24 (ref 7–35)
AST: 19 (ref 13–35)
Alkaline Phosphatase: 46 (ref 25–125)

## 2018-06-16 ENCOUNTER — Encounter: Payer: Self-pay | Admitting: Family Medicine

## 2018-06-24 ENCOUNTER — Ambulatory Visit (INDEPENDENT_AMBULATORY_CARE_PROVIDER_SITE_OTHER): Payer: 59 | Admitting: Family Medicine

## 2018-06-24 ENCOUNTER — Encounter: Payer: 59 | Admitting: Family Medicine

## 2018-06-24 ENCOUNTER — Encounter: Payer: Self-pay | Admitting: Family Medicine

## 2018-06-24 ENCOUNTER — Ambulatory Visit: Payer: 59

## 2018-06-24 VITALS — BP 104/72 | HR 84 | Temp 98.3°F | Resp 20 | Ht 60.0 in | Wt 108.2 lb

## 2018-06-24 DIAGNOSIS — Z13220 Encounter for screening for lipoid disorders: Secondary | ICD-10-CM

## 2018-06-24 DIAGNOSIS — E0500 Thyrotoxicosis with diffuse goiter without thyrotoxic crisis or storm: Secondary | ICD-10-CM

## 2018-06-24 DIAGNOSIS — F411 Generalized anxiety disorder: Secondary | ICD-10-CM

## 2018-06-24 DIAGNOSIS — Z79899 Other long term (current) drug therapy: Secondary | ICD-10-CM

## 2018-06-24 DIAGNOSIS — Z131 Encounter for screening for diabetes mellitus: Secondary | ICD-10-CM | POA: Diagnosis not present

## 2018-06-24 DIAGNOSIS — Z0000 Encounter for general adult medical examination without abnormal findings: Secondary | ICD-10-CM

## 2018-06-24 LAB — LIPID PANEL
Cholesterol: 170 mg/dL (ref 0–200)
HDL: 75 mg/dL (ref >39.00)
LDL Cholesterol: 87 mg/dL (ref 0–99)
NonHDL: 95.27
Total CHOL/HDL Ratio: 2
Triglycerides: 40 mg/dL (ref 0.0–149.0)
VLDL: 8 mg/dL (ref 0.0–40.0)

## 2018-06-24 LAB — HEMOGLOBIN A1C: Hgb A1c MFr Bld: 4.8 % (ref 4.6–6.5)

## 2018-06-24 NOTE — Patient Instructions (Signed)

## 2018-06-24 NOTE — Progress Notes (Signed)
Patient ID: Crista ElliotDyan D Buttermore, female  DOB: 11/05/1975, 42 y.o.   MRN: [id number] Patient Care Team    Relationship Specialty Notifications Start End  Natalia LeatherwoodKuneff, Renee A, DO PCP - General Family Medicine  10/06/16   Warden FillersLambeth, John, MD    10/07/16    Comment: endocrine  Jorizzo, Tandy GawJoseph L, MD Referring Physician Dermatology  10/07/16   Allie Bossierove, Myra C, MD Consulting Physician Obstetrics and Gynecology  10/07/16     Chief Complaint  Patient presents with  . Annual Exam    Subjective:  Loza D Lasota is a 42 y.o.  Female  present for CPE . All past medical history, surgical history, allergies, family history, immunizations, medications and social history were updated in the electronic medical record today. All recent labs, ED visits and hospitalizations within the last year were reviewed.  Health maintenance:  Colonoscopy: No fx, screen at 48 Mammogram: completed:06/30/2017, birads 1. Scheduled for 911/2019 Cervical cancer screening: last pap: 06/07/2017, results: normal, completed by: Dr. Marice Potterove Immunizations: tdap UTD 12/2014, Influenza declined today- [facility](encouraged yearly) Infectious disease screening: HIV completed DEXA: N/A Assistive device: none Oxygen WUJ:WJXBuse:none Patient has a Dental home. Hospitalizations/ED visits: reviewed  [facility] 06/24/2018 06/23/2017 10/06/2016 10/06/2016  Decreased Interest 0 - 1 0  Down, Depressed, Hopeless 0 1 0 0  PHQ - 2 Score 0 1 1 0  Altered sleeping 1 0 - -  Tired, decreased energy 1 1 - -  Change in appetite 0 0 - -  Feeling bad or failure about yourself  0 0 - -  Trouble concentrating 0 0 - -  Moving slowly or fidgety/restless 0 1 - -  Suicidal thoughts 0 0 - -  PHQ-9 Score 2 3 - -  Difficult doing work/chores Somewhat difficult - - -   GAD 7 : Generalized Anxiety Score 06/24/2018 10/06/2016  Nervous, Anxious, on Edge 1 3  Control/stop worrying 2 3  Worry too much - different things 1 3  Trouble relaxing 2 0    Restless 0 0  Easily annoyed or irritable 1 2  Afraid - awful might happen 1 2  Total GAD 7 Score 8 13  Anxiety Difficulty Somewhat difficult Not difficult at all   Immunization History  Administered Date(s) Administered  . Influenza,inj,Quad PF,6+ Mos 08/03/2017  . Influenza-Unspecified 06/27/2015, 06/26/2016  . Tdap 12/05/2010, 01/02/2015   Past Medical History:  Diagnosis Date  . Abnormal Pap smear of cervix 2007  . Allergy   . Depression with anxiety 2007  . GERD (gastroesophageal reflux disease)   . Graves disease   . Graves' ophthalmopathy    follows with ophthalmology  . History of positive PPD    cxr negative  . Lupus erythematosus    managed by dermatology   . Migraine   . Rosacea   . Scoliosis   . Seasonal allergies   . Urticaria 08/2016  . Varicella   . Vitamin D deficiency    Allergies  Allergen Reactions  . Other Hives    Mandarin oranges   Past Surgical History:  Procedure Laterality Date  . DILATION AND CURETTAGE OF UTERUS  2004   2004,2007  . SKIN LESION EXCISION    . TONSILLECTOMY  1978  . WISDOM TOOTH EXTRACTION  2000   Family History  Problem Relation Age of Onset  . Breast cancer Paternal Grandmother 3250  . Hypertension Mother   . Lung disease Mother   . Hypertension Father   .  Bleeding Disorder Father   . Stroke Maternal Grandmother   . Hypertension Maternal Grandmother   . Depression Sister   . Depression Maternal Aunt    Social History   Socioeconomic History  . Marital status: Married    Spouse name: Elijah Birk  . Number of children: 2  . Years of education: Post grad  . Highest education level: Not on file  Occupational History  . Occupation: stay at home mother  Social Needs  . Financial resource strain: Not on file  . Food insecurity:    Worry: Not on file    Inability: Not on file  . Transportation needs:    Medical: Not on file    Non-medical: Not on file  Tobacco Use  . Smoking status: Never Smoker  . Smokeless  tobacco: Never Used  Substance and Sexual Activity  . Alcohol use: Yes    Alcohol/week: 4.0 standard drinks    Types: 4 Cans of beer per week  . Drug use: No  . Sexual activity: Yes    Partners: Male    Birth control/protection: Surgical    Comment: husband  Lifestyle  . Physical activity:    Days per week: Not on file    Minutes per session: Not on file  . Stress: Not on file  Relationships  . Social connections:    Talks on phone: Not on file    Gets together: Not on file    Attends religious service: Not on file    Active member of club or organization: Not on file    Attends meetings of clubs or organizations: Not on file    Relationship status: Not on file  . Intimate partner violence:    Fear of current or ex partner: Not on file    Emotionally abused: Not on file    Physically abused: Not on file    Forced sexual activity: Not on file  Other Topics Concern  . Not on file  Social History Narrative   Married to [place]. 2 children [name] and [place].    SAHM. Post grad education.    Nonsmoker.    Drinks caffeine, takes a daily vitamin   Wears seatbelt, bicycle helmet, exercises routinely.    Smoke detector in the home.    Allergies as of 06/24/2018      Reactions   Other Hives   Mandarin oranges      Medication List        Accurate as of 06/24/18  2:35 PM. Always use your most recent med list.          escitalopram 20 MG tablet Commonly known as:  LEXAPRO Take by mouth.   methimazole 5 MG tablet Commonly known as:  TAPAZOLE Take 7.5 mg by mouth daily.   METRONIDAZOLE (TOPICAL) 0.75 % Lotn APPLY TOPICALLY TWICE DAILY AS DIRECTED   multivitamin tablet Take by mouth.   RELPAX 20 MG tablet Generic drug:  eletriptan TAKE ONE TABLET BY MOUTH AT ONSET OF HEADACHE AND MAY REPEAT IN 2 HOURS IF NEEDED      All past medical history, surgical history, allergies, family history, immunizations andmedications were updated in the EMR today and reviewed under the  history and medication portions of their EMR.     Recent Results (from the past 2160 hour(s))  CBC and differential     Status: None   Collection Time: 06/15/18 12:00 AM  Result Value Ref Range   Hemoglobin 13.7 12.0 - 16.0   HCT 40  36 - 46   Platelets 239 150 - 399   WBC 6.8   Basic metabolic panel     Status: None   Collection Time: 06/15/18 12:00 AM  Result Value Ref Range   Glucose 97    BUN 11 4 - 21   Creatinine 0.7 0.5 - 1.1   Potassium 4.3 3.4 - 5.3   Sodium 140 137 - 147  Hepatic function panel     Status: None   Collection Time: 06/15/18 12:00 AM  Result Value Ref Range   Alkaline Phosphatase 46 25 - 125   ALT 24 7 - 35   AST 19 13 - 35  TSH     Status: None   Collection Time: 06/15/18 12:00 AM  Result Value Ref Range   TSH 1.39 0.41 - 5.90    US Breast Ltd Uni Left Inc Axilla  Result Date: 05/26/2016 RECOMMENDATION: Screening mammogram in one year.(Code:SM-B-01Y) I have discussed the findings and recommendations with the patient. Results were also provided in writing at the conclusion of the visit. If applicable, a reminder letter will be sent to the patient regarding the next appointment. BI-RADS CATEGORY  1: Negative. Electronically Signed   By: Frederico Hamman M.D.   On: 05/26/2016 14:15   Mm Diag Breast Tomo Uni Left  Result Date: 05/26/2016 RECOMMENDATION: Screening mammogram in one year.(Code:SM-B-01Y) I have discussed the findings and recommendations with the patient. Results were also provided in writing at the conclusion of the visit. If applicable, a reminder letter will be sent to the patient regarding the next appointment. BI-RADS CATEGORY  1: Negative. Electronically Signed   By: Frederico Hamman M.D.   On: 05/26/2016 14:15   ROS: 14 pt review of systems performed and negative (unless mentioned in an HPI)  Objective: BP 104/72 (BP Location: Right Arm, Patient Position: Sitting, Cuff Size: Normal)   Pulse 84   Temp 98.3 F (36.8 C)   Resp 20   Ht  5' (1.524 m)   Wt 108 lb 4 oz (49.1 kg)   LMP 06/08/2018   SpO2 98%   BMI 21.14 kg/m  Gen: Afebrile. No acute distress. Nontoxic in appearance, well-developed, well-nourished,  Thin caucasian, female. HENT: AT. [place]. Bilateral TM visualized and normal in appearance, normal external auditory canal. MMM, no oral lesions, adequate dentition. Bilateral nares within normal limits. Throat without erythema, ulcerations or exudates. no Cough on exam, no hoarseness on exam. Eyes:Pupils Equal Round Reactive to light, Extraocular movements intact,  Conjunctiva without redness, discharge or icterus. Neck/lymp/endocrine: Supple,no lymphadenopathy, no thyromegaly CV: RRR no murmur, no edema, +2/4 P posterior tibialis pulses. no carotid bruits. No JVD. Chest: CTAB, no wheeze, rhonchi or crackles. normal Respiratory effort. good Air movement. Abd: Soft. flat. NTND. BS present. no Masses palpated. No hepatosplenomegaly. No rebound tenderness or guarding. Skin: no rashes, purpura or petechiae. Warm and well-perfused. Skin intact. Neuro/Msk:  Normal gait. PERLA. EOMi. Alert. Oriented x3.  Cranial nerves II through XII intact. Muscle strength 5/5 upper/lower extremity. DTRs equal bilaterally. Psych: Normal affect, dress and demeanor. Normal speech. Normal thought content and judgment.  No exam data present  Assessment/plan: Aislee D Spruell is a 42 y.o. female present for CPE. Anxiety state Continue lexapro Graves disease Continue follow up with endocrine Diabetes mellitus screening a1c Screening cholesterol level Lipid panel Encounter for preventive health examination Patient was encouraged to exercise greater than 150 minutes a week. Patient was encouraged to choose a diet filled with fresh fruits and vegetables,  and lean meats. AVS provided to patient today for education/recommendation on gender specific health and safety maintenance. Colonoscopy: No fx, screen at 48 Mammogram: completed:06/30/2017, birads  1. Scheduled for 911/2019 Cervical cancer screening: last pap: 06/07/2017, results: normal, completed by: Dr. Marice Potter Immunizations: tdap UTD 12/2014, Influenza declined today (encouraged yearly) Infectious disease screening: HIV completed DEXA: N/A  Return in about 1 year (around 06/25/2019) for CPE.  Electronically signed by: Felix Pacini, DO [facility] [place]

## 2018-07-06 ENCOUNTER — Ambulatory Visit (INDEPENDENT_AMBULATORY_CARE_PROVIDER_SITE_OTHER): Payer: 59

## 2018-07-06 ENCOUNTER — Ambulatory Visit: Payer: 59

## 2018-07-06 DIAGNOSIS — Z1231 Encounter for screening mammogram for malignant neoplasm of breast: Secondary | ICD-10-CM

## 2018-07-21 ENCOUNTER — Telehealth: Payer: Self-pay | Admitting: *Deleted

## 2018-07-21 NOTE — Telephone Encounter (Signed)
Received a pharmacy request for a refill on Lexapro 20mg  tab. We have not Rx'd this before. Patient was seen 06/24/18 for CPE note says to continue taking  Lexapro. Do you want to Rx this?

## 2018-07-25 NOTE — Telephone Encounter (Signed)
Spoke with patient let her know we had not recently prescribed this for her so we need approval from Dr Claiborne Billings. Patient states she is actually taking an expired lexapro medication she had from last year and would like to continue it she is [facility] Let patient know Dr Claiborne Billings will be back in office tomorrow and will review request.

## 2018-07-25 NOTE — Telephone Encounter (Signed)
Pt states she needs her medication and pharmacy still hasn't heard from office and requested she call and check on this.

## 2018-07-26 ENCOUNTER — Encounter: Payer: Self-pay | Admitting: *Deleted

## 2018-07-26 MED ORDER — ESCITALOPRAM OXALATE 20 MG PO TABS: 20.0000 mg | ORAL_TABLET | Freq: Every day | ORAL | 1 refills | Status: DC

## 2018-07-26 NOTE — Telephone Encounter (Signed)
Please inform patient the following information: Refills on lexapro 20 mg QD #90, with [facility] [place] [facility] list. follo wup in 6 mos

## 2018-07-26 NOTE — Telephone Encounter (Signed)
Sent message to patient in MY Chart . 

## 2018-09-10 ENCOUNTER — Other Ambulatory Visit: Payer: Self-pay | Admitting: Family Medicine

## 2019-01-09 ENCOUNTER — Other Ambulatory Visit: Payer: Self-pay | Admitting: Family Medicine

## 2019-01-31 ENCOUNTER — Encounter: Payer: Self-pay | Admitting: Family Medicine

## 2019-02-01 NOTE — Telephone Encounter (Signed)
There was no instructions listed on your message back in regards to weaning off her Lexapro.   Please Advise

## 2019-02-01 NOTE — Telephone Encounter (Signed)
My Chart message received from pt asking how to taper down on Lexapro to come off med completely. Pt was asked to make a virtual visit and is wanting instructions without visit. Please advise taper instructions or if patient needs to schedule appt.

## 2019-02-07 ENCOUNTER — Encounter: Payer: Self-pay | Admitting: Family Medicine

## 2019-02-07 ENCOUNTER — Ambulatory Visit (INDEPENDENT_AMBULATORY_CARE_PROVIDER_SITE_OTHER): Payer: 59 | Admitting: Family Medicine

## 2019-02-07 ENCOUNTER — Other Ambulatory Visit: Payer: Self-pay

## 2019-02-07 VITALS — Temp 98.0°F | Ht 60.0 in | Wt 107.3 lb

## 2019-02-07 DIAGNOSIS — F411 Generalized anxiety disorder: Secondary | ICD-10-CM | POA: Diagnosis not present

## 2019-02-07 NOTE — Patient Instructions (Signed)
Decreased to Lexapro 10 mg every day, by taking a half a tab of the 20 mg tab you currently have.  If after 4 weeks you decide you want to come completely off the medication-take half tab every other day for 2 weeks and then stop. If deciding you would like to continue Lexapro 10 mg after 4 weeks, call in and we will prescribe a 10 mg dose for you with a follow-up in 6 months.

## 2019-02-07 NOTE — Progress Notes (Signed)
Virtual Visit via Video   I connected with Banita D Bergerson on 02/07/19 at 11:00 AM EDT by a video enabled telemedicine application and verified that I am speaking with the correct person using two identifiers. Location patient: Home Location provider: [facility], Office Persons participating in the virtual visit: Patient, Dr. Claiborne Billings and R.Baker, LPN  I discussed the limitations of evaluation and management by telemedicine and the availability of in person appointments. The patient expressed understanding and agreed to proceed.  Subjective:   Chief Complaint  Patient presents with  . Medication Problem    Pt would like to come off Lexapro and needs instruction on how to do that     HPI:  She has been prescribed Lexapro in the past for her anxiety.  She has hyperthyroidism contributing to her anxiety.  She stopped medication last year and then restarted again in September on her own.  She reports she has been taking Lexapro 20 mg daily.  She is having sexual side effects secondary to the medication and would like to discuss tapering down or potentially off.  She feels it is working for her anxiety rather well and if able to taper just down and the side effects go away she would rather stay on at least the Lexapro 10 mg daily.  ROS: See pertinent positives and negatives per HPI.  Patient Active Problem List   Diagnosis Date Noted  . Perioral dermatitis 12/13/2017  . Vitamin D deficiency   . 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 Urticaria, chronic 10/06/2016  . Anxiety state 10/06/2016  . Lupus erythematosus 10/06/2016  . Graves' ophthalmopathy   . Graves disease 09/04/2015    Social History   Tobacco Use  . Smoking status: Never Smoker  . Smokeless tobacco: Never Used  Substance Use Topics  . Alcohol use: Yes    Alcohol/week: 4.0 standard drinks    Types: 4 Cans of beer per week    Current Outpatient Medications:  .  eletriptan (RELPAX) 20 MG tablet, TAKE ONE TABLET  BY MOUTH AT ONSET OF HEADACHE AND MAY REPEAT IN 2 HOURS IF NEEDED, Disp: 6 tablet, Rfl: 5 .  escitalopram (LEXAPRO) 20 MG tablet, TAKE 1 TABLET BY MOUTH EVERY DAY, Disp: 90 tablet, Rfl: 0 .  methimazole (TAPAZOLE) 5 MG tablet, Take 7.5 mg by mouth daily., Disp: , Rfl:  .  METRONIDAZOLE, TOPICAL, 0.75 % LOTN, APPLY TOPICALLY TWICE DAILY AS DIRECTED, Disp: , Rfl: 11 .  Multiple Vitamin (MULTIVITAMIN) tablet, Take by mouth., Disp: , Rfl:   Allergies  Allergen Reactions  . Other Hives    Mandarin oranges    Objective:  Temp 98 F (36.7 C) (Oral)   Wt 107 lb 5 oz (48.7 kg)   BMI 20.96 kg/m  Gen: No acute distress. Nontoxic in appearance.  HENT: AT. [place].  MMM.  Chest: Cough and shortness of breath not present. Psych: Normal affect, dress and demeanor. Normal speech. Normal thought content and judgment.  Assessment and Plan:  Tomeka D Spackman is a 43 y.o. female present Anxiety state -Tapered down to Lexapro 10 mg daily, starting tomorrow taking a half tab of her current 20 mg pill.  Encouraged her to try the half tab/10 mg for at least 3 to 4 weeks.  If side effects resolves at lower dose she would like to continue Lexapro 10 mg daily she will call in and I will be happy to refill these for 6 months.  If she decides she wants to  completely come off of this medication after 4 weeks, she was provided with tapering instructions of 10 mg every other day for 2 weeks and then stop. -Call in in 4 weeks if wanting Lexapro 10 mg continued.  Follow-up in 6 months if deciding to continue medication.   Felix Pacinienee Shameka Aggarwal, DO 02/07/2019

## 2019-04-03 ENCOUNTER — Other Ambulatory Visit: Payer: Self-pay | Admitting: Family Medicine

## 2019-05-06 ENCOUNTER — Other Ambulatory Visit: Payer: Self-pay | Admitting: Family Medicine

## 2019-05-08 ENCOUNTER — Encounter: Payer: Self-pay | Admitting: Family Medicine

## 2019-05-08 MED ORDER — ESCITALOPRAM OXALATE 20 MG PO TABS: 20.0000 mg | ORAL_TABLET | Freq: Every day | ORAL | 0 refills | Status: DC

## 2019-05-08 NOTE — Addendum Note (Signed)
Addended by: Caroll Rancher L on: 05/08/2019 05:01 PM   Modules accepted: Orders

## 2019-05-08 NOTE — Telephone Encounter (Signed)
Received a refill request for her lexapro. At her last appt 3 mos ago pt was considering tapering off medication or lowering her dose to 10 mg. Instructions were provided to her to do so. She was to call us back if she remained on the medication and report what dose she remained on. We did not hear from her.  Therefore, It is unclear if this is an automatic refill or pt request. If she is still desiring refill on this medication- please ask her the dose? We will refill and follow up at her CPE.   - Also, her cpe in sept needed to be moved secondary to provider vacation time. I see an echart message sent to her. Please reschedule her CPE in September.

## 2019-05-08 NOTE — Telephone Encounter (Signed)
Pt was sent my chart message back asking the total mg pt was taking daily and the pharmacy

## 2019-05-08 NOTE — Telephone Encounter (Signed)
Pt sent my chart message stating he was taking 20mg  of Lexapro daily and needed a 3 month supply sent to pharmacy. Pt did not feel the half pill (10mg ) was working well enough and does not want to come off medication.  RF request for Lexapro 20mg  daily  LOV: 02/07/2019 Next ov: 07/21/2019 Last written: 01/09/2019 #[facility] x0 [facility]   Please advise

## 2019-06-06 ENCOUNTER — Other Ambulatory Visit: Payer: Self-pay | Admitting: Family Medicine

## 2019-06-06 DIAGNOSIS — Z1231 Encounter for screening mammogram for malignant neoplasm of breast: Secondary | ICD-10-CM

## 2019-07-04 ENCOUNTER — Encounter: Payer: 59 | Admitting: Family Medicine

## 2019-07-10 ENCOUNTER — Encounter: Payer: Self-pay | Admitting: Family Medicine

## 2019-07-10 NOTE — Telephone Encounter (Signed)
Pt is asking Dr Raoul Pitch to perform pap smear at CPE (scheduled 07/21/2019 w/ Dr Raoul Pitch) instead of going to GYN. Last pap was 2018. Hx of abnormal pap in 2007. Please advise if patient needs to go to GYN.

## 2019-07-12 ENCOUNTER — Ambulatory Visit (INDEPENDENT_AMBULATORY_CARE_PROVIDER_SITE_OTHER): Payer: 59

## 2019-07-12 ENCOUNTER — Other Ambulatory Visit: Payer: Self-pay

## 2019-07-12 DIAGNOSIS — Z1231 Encounter for screening mammogram for malignant neoplasm of breast: Secondary | ICD-10-CM | POA: Diagnosis not present

## 2019-07-21 ENCOUNTER — Telehealth: Payer: Self-pay

## 2019-07-21 ENCOUNTER — Ambulatory Visit (INDEPENDENT_AMBULATORY_CARE_PROVIDER_SITE_OTHER): Payer: 59 | Admitting: Family Medicine

## 2019-07-21 ENCOUNTER — Other Ambulatory Visit: Payer: Self-pay

## 2019-07-21 ENCOUNTER — Encounter: Payer: Self-pay | Admitting: Family Medicine

## 2019-07-21 VITALS — BP 92/62 | HR 90 | Temp 98.2°F | Resp 16 | Ht 59.45 in | Wt 112.2 lb

## 2019-07-21 DIAGNOSIS — E0500 Thyrotoxicosis with diffuse goiter without thyrotoxic crisis or storm: Secondary | ICD-10-CM

## 2019-07-21 DIAGNOSIS — G43709 Chronic migraine without aura, not intractable, without status migrainosus: Secondary | ICD-10-CM | POA: Diagnosis not present

## 2019-07-21 DIAGNOSIS — Z1239 Encounter for other screening for malignant neoplasm of breast: Secondary | ICD-10-CM

## 2019-07-21 DIAGNOSIS — E559 Vitamin D deficiency, unspecified: Secondary | ICD-10-CM | POA: Diagnosis not present

## 2019-07-21 DIAGNOSIS — Z0000 Encounter for general adult medical examination without abnormal findings: Secondary | ICD-10-CM | POA: Diagnosis not present

## 2019-07-21 DIAGNOSIS — Z13220 Encounter for screening for lipoid disorders: Secondary | ICD-10-CM

## 2019-07-21 DIAGNOSIS — Z79899 Other long term (current) drug therapy: Secondary | ICD-10-CM

## 2019-07-21 DIAGNOSIS — Z130 Encounter for screening for diseases of the blood and blood-forming organs and certain disorders involving the immune mechanism: Secondary | ICD-10-CM

## 2019-07-21 DIAGNOSIS — F411 Generalized anxiety disorder: Secondary | ICD-10-CM | POA: Diagnosis not present

## 2019-07-21 DIAGNOSIS — Z131 Encounter for screening for diabetes mellitus: Secondary | ICD-10-CM

## 2019-07-21 HISTORY — DX: Chronic migraine without aura, not intractable, without status migrainosus: G43.709

## 2019-07-21 LAB — T3, FREE: T3, Free: 3.3 pg/mL (ref 2.3–4.2)

## 2019-07-21 LAB — T4, FREE: Free T4: 1.07 ng/dL (ref 0.60–1.60)

## 2019-07-21 MED ORDER — ELETRIPTAN HYDROBROMIDE 20 MG PO TABS: ORAL_TABLET | ORAL | 11 refills | Status: DC

## 2019-07-21 MED ORDER — ESCITALOPRAM OXALATE 20 MG PO TABS: 20.0000 mg | ORAL_TABLET | Freq: Every day | ORAL | 1 refills | Status: DC

## 2019-07-21 NOTE — Telephone Encounter (Signed)
Pt brought CPE quest results form from employer. Will fax back once labs are received. Form on my desk.

## 2019-07-21 NOTE — Addendum Note (Signed)
Addended by: Howard Pouch A on: 07/21/2019 05:37 PM   Modules accepted: Orders

## 2019-07-21 NOTE — Patient Instructions (Signed)
Health Maintenance, Female Adopting a healthy lifestyle and getting preventive care are important in promoting health and wellness. Ask your health care provider about:  The right schedule for you to have regular tests and exams.  Things you can do on your own to prevent diseases and keep yourself healthy. What should I know about diet, weight, and exercise? Eat a healthy diet   Eat a diet that includes plenty of vegetables, fruits, low-fat dairy products, and lean protein.  Do not eat a lot of foods that are high in solid fats, added sugars, or sodium. Maintain a healthy weight Body mass index (BMI) is used to identify weight problems. It estimates body fat based on height and weight. Your health care provider can help determine your BMI and help you achieve or maintain a healthy weight. Get regular exercise Get regular exercise. This is one of the most important things you can do for your health. Most adults should:  Exercise for at least 150 minutes each week. The exercise should increase your heart rate and make you sweat (moderate-intensity exercise).  Do strengthening exercises at least twice a week. This is in addition to the moderate-intensity exercise.  Spend less time sitting. Even light physical activity can be beneficial. Watch cholesterol and blood lipids Have your blood tested for lipids and cholesterol at 45 years of age, then have this test every 5 years. Have your cholesterol levels checked more often if:  Your lipid or cholesterol levels are high.  You are older than 45 years of age.  You are at high risk for heart disease. What should I know about cancer screening? Depending on your health history and family history, you may need to have cancer screening at various ages. This may include screening for:  Breast cancer.  Cervical cancer.  Colorectal cancer.  Skin cancer.  Lung cancer. What should I know about heart disease, diabetes, and high blood  pressure? Blood pressure and heart disease  High blood pressure causes heart disease and increases the risk of stroke. This is more likely to develop in people who have high blood pressure readings, are of African descent, or are overweight.  Have your blood pressure checked: ? Every 3-5 years if you are 18-39 years of age. ? Every year if you are 40 years old or older. Diabetes Have regular diabetes screenings. This checks your fasting blood sugar level. Have the screening done:  Once every three years after age 40 if you are at a normal weight and have a low risk for diabetes.  More often and at a younger age if you are overweight or have a high risk for diabetes. What should I know about preventing infection? Hepatitis B If you have a higher risk for hepatitis B, you should be screened for this virus. Talk with your health care provider to find out if you are at risk for hepatitis B infection. Hepatitis C Testing is recommended for:  Everyone born from 1945 through 1965.  Anyone with known risk factors for hepatitis C. Sexually transmitted infections (STIs)  Get screened for STIs, including gonorrhea and chlamydia, if: ? You are sexually active and are younger than 45 years of age. ? You are older than 45 years of age and your health care provider tells you that you are at risk for this type of infection. ? Your sexual activity has changed since you were last screened, and you are at increased risk for chlamydia or gonorrhea. Ask your health care provider if   you are at risk.  Ask your health care provider about whether you are at high risk for HIV. Your health care provider may recommend a prescription medicine to help prevent HIV infection. If you choose to take medicine to prevent HIV, you should first get tested for HIV. You should then be tested every 3 months for as long as you are taking the medicine. Pregnancy  If you are about to stop having your period (premenopausal) and  you may become pregnant, seek counseling before you get pregnant.  Take 400 to 800 micrograms (mcg) of folic acid every day if you become pregnant.  Ask for birth control (contraception) if you want to prevent pregnancy. Osteoporosis and menopause Osteoporosis is a disease in which the bones lose minerals and strength with aging. This can result in bone fractures. If you are 65 years old or older, or if you are at risk for osteoporosis and fractures, ask your health care provider if you should:  Be screened for bone loss.  Take a calcium or vitamin D supplement to lower your risk of fractures.  Be given hormone replacement therapy (HRT) to treat symptoms of menopause. Follow these instructions at home: Lifestyle  Do not use any products that contain nicotine or tobacco, such as cigarettes, e-cigarettes, and chewing tobacco. If you need help quitting, ask your health care provider.  Do not use street drugs.  Do not share needles.  Ask your health care provider for help if you need support or information about quitting drugs. Alcohol use  Do not drink alcohol if: ? Your health care provider tells you not to drink. ? You are pregnant, may be pregnant, or are planning to become pregnant.  If you drink alcohol: ? Limit how much you use to 0-1 drink a day. ? Limit intake if you are breastfeeding.  Be aware of how much alcohol is in your drink. In the [place] one drink equals one 12 oz bottle of beer (355 mL), one 5 oz glass of wine (148 mL), or one 1 oz glass of hard liquor (44 mL). General instructions  Schedule regular health, dental, and eye exams.  Stay current with your vaccines.  Tell your health care provider if: ? You often feel depressed. ? You have ever been abused or do not feel safe at home. Summary  Adopting a healthy lifestyle and getting preventive care are important in promoting health and wellness.  Follow your health care provider's instructions about healthy  diet, exercising, and getting tested or screened for diseases.  Follow your health care provider's instructions on monitoring your cholesterol and blood pressure. This information is not intended to replace advice given to you by your health care provider. Make sure you discuss any questions you have with your health care provider. Document Released: 04/27/2011 Document Revised: 10/05/2018 Document Reviewed: 10/05/2018 Elsevier Patient Education  2020 Elsevier Inc.  

## 2019-07-21 NOTE — Progress Notes (Signed)
Patient ID: Amy Delacruz, female  DOB: 05/04/1976, 43 y.o.   MRN: [id number] Patient Care Team    Relationship Specialty Notifications Start End  Natalia LeatherwoodKuneff, Tashala Cumbo A, DO PCP - General Family Medicine  10/06/16   Warden FillersLambeth, John, MD    10/07/16    Comment: endocrine  Jorizzo, Tandy GawJoseph L, MD Referring Physician Dermatology  10/07/16   Allie Bossierove, Myra C, MD Consulting Physician Obstetrics and Gynecology  10/07/16     Chief Complaint  Patient presents with  . Annual Exam    fasting. mamm 07/12/2019. pap 06/07/2017. had flu shot a couple weeks ago.     Subjective:  Amy Delacruz is a 43 y.o.  Female  present for CPE. All past medical history, surgical history, allergies, family history, immunizations, medications and social history were updated in the electronic medical record today. All recent labs, ED visits and hospitalizations within the last year were reviewed.  Anxiety:  Doing well on Lexapro 20 mg Qd.   Health maintenance: updated 07/21/19 Colonoscopy:No fx, screen at 48 Mammogram: completed:07/12/2019. [facility] Cervical cancer screening: last pap:06/07/2017, results:normal, with neg HPV completed by:Dr. Marice Potterove. Rpt 3-5 yrs, Immunizations: tdapUTD 12/2014, InfluenzaUTD 07/02/2019(encouraged yearly) Infectious disease screening: HIVcompleted DEXA:N/A Assistive device: none Oxygen WUJ:WJXBuse:none Patient has a Dental home. Hospitalizations/ED visits: reviewed   Dep[facility] 2/9 07/21/2019 06/24/2018 06/23/2017 10/06/2016 10/06/2016  Decreased Interest 0 0 - 1 0  Down, Depressed, Hopeless 0 0 1 0 0  PHQ - 2 Score 0 0 1 1 0  Altered sleeping - 1 0 - -  Tired, decreased energy - 1 1 - -  Change in appetite - 0 0 - -  Feeling bad or failure about yourself  - 0 0 - -  Trouble concentrating - 0 0 - -  Moving slowly or fidgety/restless - 0 1 - -  Suicidal thoughts - 0 0 - -  PHQ-9 Score - 2 3 - -  Difficult doing work/chores - Somewhat difficult - - -   GAD 7 :  Generalized Anxiety Score 07/21/2019 06/24/2018 10/06/2016  Nervous, Anxious, on Edge 2 1 3   Control/stop worrying 0 2 3  Worry too much - different things 3 1 3   Trouble relaxing 3 2 0  Restless 3 0 0  Easily annoyed or irritable 1 1 2   Afraid - awful might happen 1 1 2   Total GAD 7 Score 13 8 13   Anxiety Difficulty Very difficult Somewhat difficult Not difficult at all    Immunization History  Administered Date(s) Administered  . Influenza,inj,Quad PF,6+ Mos 08/03/2017  . Influenza,inj,quad, With Preservative 07/03/2018, 07/09/2019  . Influenza-Unspecified 06/27/2015, 06/26/2016  . Tdap 12/05/2010, 01/02/2015    Past Medical History:  Diagnosis Date  . Abnormal Pap smear of cervix 2007  . Allergy   . Depression with anxiety 2007  . GERD (gastroesophageal reflux disease)   . Graves disease   . Graves' ophthalmopathy    follows with ophthalmology  . History of positive PPD    cxr negative  . Lupus erythematosus    managed by dermatology   . Migraine   . Rosacea   . Scoliosis   . Seasonal allergies   . Urticaria 08/2016  . Varicella   . Vitamin D deficiency    Allergies  Allergen Reactions  . Other Hives    Mandarin oranges   Past Surgical History:  Procedure Laterality Date  . DILATION AND CURETTAGE OF UTERUS  2004   2004,2007  .  SKIN LESION EXCISION    . TONSILLECTOMY  1978  . WISDOM TOOTH EXTRACTION  2000   Family History  Problem Relation Age of Onset  . Breast cancer Paternal Grandmother 24  . Hypertension Mother   . Lung disease Mother   . Hypertension Father   . Bleeding Disorder Father   . Stroke Maternal Grandmother   . Hypertension Maternal Grandmother   . Depression Sister   . Depression Maternal Aunt    Social History   Social History Narrative   Married to Amy Delacruz. 2 children Amy Delacruz and Amy Delacruz.    SAHM. Post grad education.    Nonsmoker.    Drinks caffeine, takes a daily vitamin   Wears seatbelt, bicycle helmet, exercises routinely.     Smoke detector in the home.     Allergies as of 07/21/2019      Reactions   Other Hives   Mandarin oranges      Medication List       Accurate as of July 21, 2019  8:27 AM. If you have any questions, ask your nurse or doctor.        eletriptan 20 MG tablet Commonly known as: RELPAX TAKE ONE TABLET BY MOUTH AT ONSET OF HEADACHE AND MAY REPEAT IN 2 HOURS IF NEEDED   escitalopram 20 MG tablet Commonly known as: LEXAPRO Take 1 tablet (20 mg total) by mouth daily.   methimazole 5 MG tablet Commonly known as: TAPAZOLE Take 7.5 mg by mouth daily.   METRONIDAZOLE (TOPICAL) 0.75 % Lotn APPLY TOPICALLY TWICE DAILY AS DIRECTED What changed: Another medication with the same name was removed. Continue taking this medication, and follow the directions you see here. Changed by: Felix Pacini, DO   multivitamin tablet Take by mouth.      All past medical history, surgical history, allergies, family history, immunizations andmedications were updated in the EMR today and reviewed under the history and medication portions of their EMR.     No results found for this or any previous visit (from the past 2160 hour(s)).   ROS: 14 pt review of systems performed and negative (unless mentioned in an HPI)  Objective: BP 92/62 (BP Location: Left Arm, Patient Position: Sitting, Cuff Size: Normal)   Pulse 90   Temp 98.2 F (36.8 C) (Temporal)   Resp 16   Ht 4' 11.45" (1.51 m)   Wt 112 lb 4 oz (50.9 kg)   LMP 07/16/2019 (Exact Date)   SpO2 99%   BMI 22.33 kg/m  Gen: Afebrile. No acute distress. Nontoxic in appearance, well-developed, well-nourished,  Thin, caucasian female. HENT: AT. [place]. Bilateral TM visualized and normal in appearance, normal external auditory canal. MMM, no oral lesions, adequate dentition. Bilateral nares within normal limits. Throat without erythema, ulcerations or exudates. no Cough on exam, no hoarseness on exam. Eyes:Pupils Equal Round Reactive to light,  Extraocular movements intact,  Conjunctiva without redness, discharge or icterus. Neck/lymp/endocrine: Supple,no lymphadenopathy, no thyromegaly CV: RRR no murmur, no edema, +2/4 P posterior tibialis pulses. no carotid bruits. No JVD. Chest: CTAB, no wheeze, rhonchi or crackles. normal Respiratory effort. good Air movement. Abd: Soft. flat. NTND. BS present. no Masses palpated. No hepatosplenomegaly. No rebound tenderness or guarding. Skin: no rashes, purpura or petechiae. Warm and well-perfused. Skin intact. Neuro/Msk:  Normal gait. PERLA. EOMi. Alert. Oriented x3.  Cranial nerves II through XII intact. Muscle strength 5/5 upper/lower extremity. DTRs equal bilaterally. Psych: Normal affect, dress and demeanor. Normal speech. Normal thought content and judgment.  No exam data present  Assessment/plan: Amy Delacruz is a 43 y.o. female present for CPE Anxiety state Stable.  Refilled Lexapro 20 mg for her today.  Follow-up in 6 months. Graves disease/Graves' ophthalmopathy Has been stable.  Currently prescribed Tapazole through endocrine.  She reports she is taking alternate days 5/2.5 mg of Tapazole daily. Patient was encouraged to continue following with her endocrine for medication management - Comprehensive metabolic panel - TSH - T4, free - T3, free Vitamin D deficiency - Vitamin D (25 hydroxy Chronic migraine without aura without status migrainosus, not intractable Stable.  Refilled reflex for her today. Lipid screening - Lipid panel Encounter for long-term current use of medication - Comprehensive metabolic panel Screening for deficiency anemia - CBC with Differential/Platelet Diabetes mellitus screening - Hemoglobin A1c Encounter for preventative health exam Patient was encouraged to exercise greater than 150 minutes a week. Patient was encouraged to choose a diet filled with fresh fruits and vegetables, and lean meats. AVS provided to patient today for  education/recommendation on gender specific health and safety maintenance. Colonoscopy:No fx, screen at 48 Mammogram: completed:07/12/2019. [facility] Cervical cancer screening: last pap:06/07/2017, results:normal, with neg HPV completed by:Dr. Hulan Fray. Rpt 3-5 yrs, Immunizations: tdapUTD 12/2014, InfluenzaUTD 07/02/2019(encouraged yearly) Infectious disease screening: HIVcompleted DEXA:N/A  Return in about 1 year (around 07/20/2020) for CPE (30 min). Follow up 6 mos [facility]  Electronically signed by: Howard Pouch, [place]

## 2019-07-22 ENCOUNTER — Other Ambulatory Visit: Payer: Self-pay | Admitting: Family Medicine

## 2019-07-22 LAB — CBC WITH DIFFERENTIAL/PLATELET
Absolute Monocytes: 293 cells/uL (ref 200–950)
Basophils Absolute: 32 cells/uL (ref 0–200)
Basophils Relative: 0.7 %
Eosinophils Absolute: 0 cells/uL — ABNORMAL LOW (ref 15–500)
Eosinophils Relative: 0 %
HCT: 44.7 % (ref 35.0–45.0)
Hemoglobin: 15.2 g/dL (ref 11.7–15.5)
Lymphs Abs: 1463 cells/uL (ref 850–3900)
MCH: 31.5 pg (ref 27.0–33.0)
MCHC: 34 g/dL (ref 32.0–36.0)
MCV: 92.5 fL (ref 80.0–100.0)
MPV: 9.2 fL (ref 7.5–12.5)
Monocytes Relative: 6.5 %
Neutro Abs: 2714 cells/uL (ref 1500–7800)
Neutrophils Relative %: 60.3 %
Platelets: 231 10*3/uL (ref 140–400)
RBC: 4.83 10*6/uL (ref 3.80–5.10)
RDW: 11.7 % (ref 11.0–15.0)
Total Lymphocyte: 32.5 %
WBC: 4.5 10*3/uL (ref 3.8–10.8)

## 2019-07-22 LAB — COMPREHENSIVE METABOLIC PANEL
AG Ratio: 1.8 (calc) (ref 1.0–2.5)
ALT: 10 U/L (ref 6–29)
AST: 18 U/L (ref 10–30)
Albumin: 4.4 g/dL (ref 3.6–5.1)
Alkaline phosphatase (APISO): 39 U/L (ref 31–125)
BUN: 16 mg/dL (ref 7–25)
CO2: 25 mmol/L (ref 20–32)
Calcium: 9.1 mg/dL (ref 8.6–10.2)
Chloride: 103 mmol/L (ref 98–110)
Creat: 0.79 mg/dL (ref 0.50–1.10)
Globulin: 2.4 g/dL (calc) (ref 1.9–3.7)
Glucose, Bld: 84 mg/dL (ref 65–99)
Potassium: 3.9 mmol/L (ref 3.5–5.3)
Sodium: 139 mmol/L (ref 135–146)
Total Bilirubin: 0.6 mg/dL (ref 0.2–1.2)
Total Protein: 6.8 g/dL (ref 6.1–8.1)

## 2019-07-22 LAB — TSH: TSH: 1.94 mIU/L

## 2019-07-22 LAB — LIPID PANEL
Cholesterol: 182 mg/dL (ref <200)
HDL: 69 mg/dL (ref >=50)
LDL Cholesterol (Calc): 98 mg/dL (calc)
Non-HDL Cholesterol (Calc): 113 mg/dL (calc) (ref <130)
Total CHOL/HDL Ratio: 2.6 (calc) (ref <5.0)
Triglycerides: 68 mg/dL (ref <150)

## 2019-07-22 LAB — HEMOGLOBIN A1C
Hgb A1c MFr Bld: 4.8 % of total Hgb (ref <5.7)
Mean Plasma Glucose: 91 (calc)
eAG (mmol/L): 5 (calc)

## 2019-07-22 LAB — VITAMIN D 25 HYDROXY (VIT D DEFICIENCY, FRACTURES): Vit D, 25-Hydroxy: 29 ng/mL — ABNORMAL LOW (ref 30–100)

## 2019-07-23 ENCOUNTER — Other Ambulatory Visit: Payer: Self-pay | Admitting: Family Medicine

## 2019-07-23 MED ORDER — ESCITALOPRAM OXALATE 20 MG PO TABS: 20.0000 mg | ORAL_TABLET | Freq: Every day | ORAL | 1 refills | Status: DC

## 2019-07-26 NOTE — Telephone Encounter (Signed)
Physical form completed and faxed with confirmation.  Sent to scan.

## 2019-08-05 ENCOUNTER — Encounter: Payer: Self-pay | Admitting: Family Medicine

## 2019-08-11 ENCOUNTER — Encounter: Payer: Self-pay | Admitting: Family Medicine

## 2019-08-16 ENCOUNTER — Encounter: Payer: Self-pay | Admitting: Family Medicine

## 2019-09-13 ENCOUNTER — Telehealth: Payer: Self-pay | Admitting: Family Medicine

## 2019-09-13 NOTE — Telephone Encounter (Signed)
Patient called in for ITG form. It was not received by [facility]. Patient requested form to be emailed to her. Emailed completed [facility] form to the patient.

## 2019-09-29 ENCOUNTER — Telehealth: Payer: Self-pay | Admitting: Family Medicine

## 2019-09-29 NOTE — Telephone Encounter (Signed)
Patient called regarding her bill from her physical back in September. She stated she was very frustrated at this point, she has been trying to get this taken care of since Sept. Her bill is coming from [facility] , they are saying they cant change any coding and advised her to call her PCP. Ardeen Fillers sent her an email on 10/23 letting her know visit was coded correctly.  She said her husband cam in for his physical, got the exact same labs done, and his visit was pain 100% and did not owe for copay or labs,etc  Patient can be reached at [phone]  Sent to [place]. To advise

## 2019-11-09 ENCOUNTER — Encounter: Payer: Self-pay | Admitting: Family Medicine

## 2019-11-09 NOTE — Telephone Encounter (Signed)
Patient calling regarding bill.  She has called multiple times since she has received bill for her annual physical. She stated that she has spoken Victorino Dike before and asked for her personally.  [place] office told her she was in [facility] office today.  Sending note to [facility] for review. Patient can be reached at [phone].

## 2019-11-19 ENCOUNTER — Encounter: Payer: Self-pay | Admitting: Family Medicine

## 2019-12-21 ENCOUNTER — Encounter: Payer: Self-pay | Admitting: Family Medicine

## 2019-12-28 ENCOUNTER — Other Ambulatory Visit: Payer: Self-pay

## 2019-12-28 ENCOUNTER — Encounter: Payer: Self-pay | Admitting: Obstetrics & Gynecology

## 2019-12-28 ENCOUNTER — Ambulatory Visit: Payer: 59 | Admitting: Obstetrics & Gynecology

## 2019-12-28 VITALS — BP 121/73 | HR 72 | Temp 98.3°F | Resp 16 | Ht 60.0 in | Wt 113.0 lb

## 2019-12-28 DIAGNOSIS — N938 Other specified abnormal uterine and vaginal bleeding: Secondary | ICD-10-CM | POA: Diagnosis not present

## 2019-12-28 DIAGNOSIS — Z1151 Encounter for screening for human papillomavirus (HPV): Secondary | ICD-10-CM | POA: Diagnosis not present

## 2019-12-28 DIAGNOSIS — Z124 Encounter for screening for malignant neoplasm of cervix: Secondary | ICD-10-CM

## 2019-12-28 DIAGNOSIS — Z0000 Encounter for general adult medical examination without abnormal findings: Secondary | ICD-10-CM

## 2019-12-28 DIAGNOSIS — Z01411 Encounter for gynecological examination (general) (routine) with abnormal findings: Secondary | ICD-10-CM | POA: Diagnosis not present

## 2019-12-28 NOTE — Progress Notes (Signed)
   Subjective:    Patient ID: Amy Delacruz, female    DOB: 18-Dec-1975, 44 y.o.   MRN: [id number]  HPI 44 yo married P2 (56 and 49 yo daughters) here today with the issue irregular bleeding. She reports that her periods are normally regular about q 21-25 days but now she is having a period q 2 weeks. This has been going on for a couple of months.  She has thyroid disease and is followed by a endocrinologist. Her TFTs were last checked 8/20 with her primary care doctor.  She is frustrated also because lexapro (for last 18 months+) has killed her sex drive.  She has tried to decrease her dose.   Review of Systems Husband has had a vasectomy. Mammogram is UTD. She had a flu vaccine this season. Monogamous for 25 years.    Objective:   Physical Exam Breathing, conversing, and ambulating normally Well nourished, well hydrated White female, no apparent distress Cervix appears normal normal size and shape, retroverted, mobile, non-tender, normal adnexal exam     Assessment & Plan:  Dub- check TFTs today Preventative care- check pap smear today Gyn u/s ordered

## 2019-12-29 LAB — VITAMIN D 25 HYDROXY (VIT D DEFICIENCY, FRACTURES): Vit D, 25-Hydroxy: 26 ng/mL — ABNORMAL LOW (ref 30–100)

## 2019-12-29 LAB — T3, FREE: T3, Free: 2.8 pg/mL (ref 2.3–4.2)

## 2019-12-29 LAB — CYTOLOGY - PAP
Comment: NEGATIVE
Diagnosis: NEGATIVE
High risk HPV: NEGATIVE

## 2019-12-29 LAB — TSH: TSH: 1.3 mIU/L

## 2019-12-29 LAB — T4, FREE: Free T4: 1.3 ng/dL (ref 0.8–1.8)

## 2020-01-30 ENCOUNTER — Ambulatory Visit: Payer: 59 | Admitting: Family Medicine

## 2020-01-30 ENCOUNTER — Encounter: Payer: Self-pay | Admitting: Family Medicine

## 2020-01-30 ENCOUNTER — Other Ambulatory Visit: Payer: Self-pay

## 2020-01-30 VITALS — BP 115/80 | HR 70 | Temp 98.0°F | Resp 16 | Ht 60.0 in | Wt 114.4 lb

## 2020-01-30 DIAGNOSIS — H9193 Unspecified hearing loss, bilateral: Secondary | ICD-10-CM

## 2020-01-30 HISTORY — DX: Unspecified hearing loss, bilateral: H91.93

## 2020-01-30 NOTE — Progress Notes (Signed)
This visit occurred during the SARS-CoV-2 public health emergency.  Safety protocols were in place, including screening questions prior to the visit, additional usage of staff PPE, and extensive cleaning of exam room while observing appropriate contact time as indicated for disinfecting solutions.    Amy Delacruz , Aug 31, 1976, 44 y.o., female MRN: [id number] Patient Care Team    Relationship Specialty Notifications Start End  Natalia Leatherwood, DO PCP - General Family Medicine  10/06/16   Warden Fillers, MD    10/07/16    Comment: endocrine  Jorizzo, Tandy Gaw, MD Referring Physician Dermatology  10/07/16   Allie Bossier, MD Consulting Physician Obstetrics and Gynecology  10/07/16     Chief Complaint  Patient presents with  . Hearing Loss    Pts husband told her she is speaking loudly and feels she cannot hear her kids when they speak      Subjective: Pt presents for an OV with concerns over possible hearing loss.  She reports her family has been noticing she is speaking more loudly her for the past year.  She listens to TV rather loudly.  She has difficulty hearing her phone or speaking on the phone and has for volume on high.  She reports she thinks she is doing okay but her family is concerned.  Depression screen [facility] 2/9 07/21/2019 06/24/2018 06/23/2017 10/06/2016 10/06/2016  Decreased Interest 0 0 - 1 0  Down, Depressed, Hopeless 0 0 1 0 0  PHQ - 2 Score 0 0 1 1 0  Altered sleeping - 1 0 - -  Tired, decreased energy - 1 1 - -  Change in appetite - 0 0 - -  Feeling bad or failure about yourself  - 0 0 - -  Trouble concentrating - 0 0 - -  Moving slowly or fidgety/restless - 0 1 - -  Suicidal thoughts - 0 0 - -  PHQ-9 Score - 2 3 - -  Difficult doing work/chores - Somewhat difficult - - -    Allergies  Allergen Reactions  . Other Hives    Mandarin oranges   Social History   Social History Narrative   Married to [place]. 2 children [name] and Ladera.    SAHM. Post grad  education.    Nonsmoker.    Drinks caffeine, takes a daily vitamin   Wears seatbelt, bicycle helmet, exercises routinely.    Smoke detector in the home.    Past Medical History:  Diagnosis Date  . Abnormal Pap smear of cervix 2007  . Allergy   . Depression with anxiety 2007  . GERD (gastroesophageal reflux disease)   . Graves disease   . Graves' ophthalmopathy    follows with ophthalmology  . History of positive PPD    cxr negative  . Lupus erythematosus    managed by dermatology   . Migraine   . Rosacea   . Scoliosis   . Seasonal allergies   . Urticaria 08/2016  . Varicella   . Vitamin D deficiency    Past Surgical History:  Procedure Laterality Date  . DILATION AND CURETTAGE OF UTERUS  2004   2004,2007  . SKIN LESION EXCISION    . TONSILLECTOMY  1978  . WISDOM TOOTH EXTRACTION  2000   Family History  Problem Relation Age of Onset  . Breast cancer Paternal Grandmother 66  . Hypertension Mother   . Lung disease Mother   . Hypertension Father   . Bleeding Disorder Father   .  Stroke Maternal Grandmother   . Hypertension Maternal Grandmother   . Depression Sister   . Depression Maternal Aunt    Allergies as of 01/30/2020      Reactions   Other Hives   Mandarin oranges      Medication List       Accurate as of January 30, 2020  5:03 PM. If you have any questions, ask your nurse or doctor.        STOP taking these medications   cycloSPORINE (PF) 0.09 % Soln Stopped by: Howard Pouch, DO   Vitamin D3 10 MCG (400 UNIT) tablet Stopped by: Howard Pouch, DO     TAKE these medications   eletriptan 20 MG tablet Commonly known as: RELPAX TAKE ONE TABLET BY MOUTH AT ONSET OF HEADACHE AND MAY REPEAT IN 2 HOURS IF NEEDED   escitalopram 20 MG tablet Commonly known as: LEXAPRO Take 1 tablet (20 mg total) by mouth daily.   methimazole 5 MG tablet Commonly known as: TAPAZOLE Take 7.5 mg by mouth daily.   METRONIDAZOLE (TOPICAL) 0.75 % Lotn APPLY TOPICALLY TWICE  DAILY AS DIRECTED   multivitamin tablet Take by mouth.       All past medical history, surgical history, allergies, family history, immunizations andmedications were updated in the EMR today and reviewed under the history and medication portions of their EMR.     ROS: Negative, with the exception of above mentioned in HPI   Objective:  BP 115/80 (BP Location: Left Arm, Patient Position: Sitting, Cuff Size: Normal)   Pulse 70   Temp 98 F (36.7 C) (Temporal)   Resp 16   Ht 5' (1.524 m)   Wt 114 lb 6 oz (51.9 kg)   LMP 01/28/2020 (Exact Date)   SpO2 100%   BMI 22.34 kg/m  Body mass index is 22.34 kg/m. Gen: Afebrile. No acute distress. Nontoxic in appearance, well developed, well nourished.  HENT: AT. [place]. Bilateral TM visualized without erythema, effusion or bulging.  External auditory canals are clear without cerumen, swelling or drainage.. MMM, no oral lesions.  Eyes:Pupils Equal Round Reactive to light, Extraocular movements intact,  Conjunctiva without redness, discharge or icterus. Neck/lymp/endocrine: Supple, no lymphadenopathy Neuro: Normal gait. PERLA. EOMi. Alert. Oriented x3    Hearing Screening   125Hz  250Hz  500Hz  1000Hz  2000Hz  3000Hz  4000Hz  6000Hz  8000Hz   Right ear:   20 20 20       Left ear:   30 20 20        No results found. No results found for this or any previous visit (from the past 24 hour(s)).  Assessment/Plan: [name] is a 44 y.o. female present for OV for  Hearing difficulty of both ears Discussed essentially normal hearing on screening today.  Possibly very mild hearing loss in the left ear with low-frequency-patient states she heard the tone without difficulty with  uncertainty which ear she heard the tone in.  She is happy with the results today and does not desire further testing or referral at this time.   Reviewed expectations re: course of current medical issues.  Discussed self-management of symptoms.  Outlined signs and symptoms  indicating need for more acute intervention.  Patient verbalized understanding and all questions were answered.  Patient received an After-Visit Summary.    No orders of the defined types were placed in this encounter.  No orders of the defined types were placed in this encounter.  Referral Orders  No referral(s) requested today     Note  is dictated utilizing voice recognition software. Although note has been proof read prior to signing, occasional typographical errors still can be missed. If any questions arise, please do not hesitate to call for verification.   electronically signed by:  Howard Pouch, DO  [place]

## 2020-01-30 NOTE — Patient Instructions (Signed)
Hearing is normal today on screening.  If you would like further evaluation we can ... it is up to you and screen is normal.

## 2020-05-06 ENCOUNTER — Other Ambulatory Visit: Payer: Self-pay

## 2020-05-06 ENCOUNTER — Ambulatory Visit: Payer: 59 | Admitting: Family Medicine

## 2020-05-06 ENCOUNTER — Encounter: Payer: Self-pay | Admitting: Family Medicine

## 2020-05-06 VITALS — BP 100/64 | HR 71 | Temp 98.6°F | Ht 60.0 in | Wt 117.8 lb

## 2020-05-06 DIAGNOSIS — M25572 Pain in left ankle and joints of left foot: Secondary | ICD-10-CM

## 2020-05-06 NOTE — Patient Instructions (Addendum)
Stay off of ankle for a week, can use heating pad then update [place].  We will call you within two weeks about your referral to sports medicine. If you do not hear within 3 weeks, give [place] a call.

## 2020-05-06 NOTE — Progress Notes (Signed)
Phone [phone] In person visit   Subjective:   Amy Delacruz is a 44 y.o. year old very pleasant female patient who presents for/with See problem oriented charting Chief Complaint  Patient presents with  . Follow-up   This visit occurred during the SARS-CoV-2 public health emergency.  Safety protocols were in place, including screening questions prior to the visit, additional usage of staff PPE, and extensive cleaning of exam room while observing appropriate contact time as indicated for disinfecting solutions.   Past Medical History-  Patient Active Problem List   Diagnosis Date Noted  . Hearing difficulty of both ears 01/30/2020  . Migraine, chronic, without aura 07/21/2019  . Perioral dermatitis 12/13/2017  . Vitamin D deficiency   . 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 Urticaria, chronic 10/06/2016  . Anxiety state 10/06/2016  . Lupus erythematosus 10/06/2016  . Graves' ophthalmopathy   . Graves disease 09/04/2015    Medications- reviewed and updated Current Outpatient Medications  Medication Sig Dispense Refill  . eletriptan (RELPAX) 20 MG tablet TAKE ONE TABLET BY MOUTH AT ONSET OF HEADACHE AND MAY REPEAT IN 2 HOURS IF NEEDED 6 tablet 11  . escitalopram (LEXAPRO) 20 MG tablet Take 1 tablet (20 mg total) by mouth daily. 90 tablet 1  . methimazole (TAPAZOLE) 5 MG tablet Take 7.5 mg by mouth daily.    Marland Kitchen METRONIDAZOLE, TOPICAL, 0.75 % LOTN APPLY TOPICALLY TWICE DAILY AS DIRECTED  11  . Multiple Vitamin (MULTIVITAMIN) tablet Take by mouth.     No current facility-administered medications for this visit.     Objective:  BP 100/64   Pulse 71   Temp 98.6 F (37 C)   Ht 5' (1.524 m)   Wt 117 lb 12.8 oz (53.4 kg)   SpO2 98%   BMI 23.01 kg/m  Gen: NAD, resting comfortably MSK: No pain over posterior or anterior portions of medial and lateral malleolus.  Stable ankle ligaments.  Patient does have pain with palpation and rather significant anterior to lateral  malleolus.  With extremes of flexion and extension of the foot she also complains of pain in the same area    Assessment and Plan  # Left ankle pain S: pt states 3 1/2 weeks ago her left foot slipped over a curb and the foot inverted and she fell forward onto her right leg.  She has had pain anterior to the lateral malleolus since that time.  She used ice/rest per day and that seemed to help but then became active again.  She started with some new shoes in 3 to 4 days after her initial injury noted worsening pain-pain seems to be worse when anything is pressing in the area.  She has she feels better with flip-flops.  Foot was significantly bruised at first but that has improved.  She tried an ankle brace with 5 Velcro straps and a metal bar on the side for support but this actually worsens pain.  On a regular basis she walks an hour a day on the treadmill and she is continue this and also was at [place] and [place] 4.5 miles.  She notes that pain is worse with walking and standing.  She continues have swelling anterior to the lateral malleolus.  She has been on it a lot and has been taking tylenol and advil.  She likes to avoid Advil if possible she rates her pain as constant that's getting worse and would give it a 3/10 A/P: Patient with 3-1/2 weeks of left  lateral ankle pain.  I suspect muscular or tendon strain-at least on exam today ligaments appear intact.  I am less suspicious of fracture we discussed possibly doing an x-ray-technically she does not require this based on [place] ankle rules.  We also discussed sports medicine referral as an alternate and if they thought x-ray was indicated could be obtained at the time of her visit-she prefers a second option.  Ultimately she requested [facility] location-I believe they have ultrasound and x-ray on site.  The bigger thing I would like for her to do is start with relative rest and decrease her walking until evaluation-she agrees to  do so and cut out her our treadmill until that visit.  She can also try heating pad and continue Tylenol.  Discussed possible oral NSAIDs trial for a week but she is concerned about rebound headaches so opted to hold off for now  Recommended follow up: No follow-ups on file. Future Appointments  Date Time Provider Department Center  07/24/2020  3:40 PM [facility]- MM 1 [facility]  07/24/2020  4:00 PM [facility]- DEXA [facility]    Lab/Order associations:   ICD-10-CM   1. Left ankle pain, unspecified chronicity  M25.572 Ambulatory referral to Sports Medicine    Time Spent: 24 minutes of total time (11:44 AM- 12:08 PM) was spent on the date of the encounter performing the following actions: chart review prior to seeing the patient, obtaining history, performing a medically necessary exam, counseling on the treatment plan, placing orders, and documenting in our EHR.   Return precautions advised.  Tana Conch, MD

## 2020-05-07 ENCOUNTER — Telehealth: Payer: Self-pay | Admitting: Family Medicine

## 2020-05-07 NOTE — Telephone Encounter (Signed)
Patient is calling in stating the referral was to the wrong clinic, so she asked instead of getting another referral could she just get an x-ray.

## 2020-05-07 NOTE — Telephone Encounter (Signed)
Asked if the x-ray could be to the med-center in [place].

## 2020-05-08 NOTE — Telephone Encounter (Signed)
No you can order through me - I thought yall input this as [place] as she requested? Where did she say referral was placed to?

## 2020-05-08 NOTE — Telephone Encounter (Signed)
Do I need to send this to pcp

## 2020-05-09 ENCOUNTER — Encounter: Payer: Self-pay | Admitting: Family Medicine

## 2020-05-10 ENCOUNTER — Other Ambulatory Visit: Payer: Self-pay

## 2020-05-10 DIAGNOSIS — M25572 Pain in left ankle and joints of left foot: Secondary | ICD-10-CM

## 2020-05-10 NOTE — Telephone Encounter (Signed)
Order placed l/m on voice mail and send my chart to let patient know.

## 2020-05-11 ENCOUNTER — Other Ambulatory Visit: Payer: Self-pay

## 2020-05-11 ENCOUNTER — Ambulatory Visit (INDEPENDENT_AMBULATORY_CARE_PROVIDER_SITE_OTHER): Payer: 59

## 2020-05-11 DIAGNOSIS — M25572 Pain in left ankle and joints of left foot: Secondary | ICD-10-CM | POA: Diagnosis not present

## 2020-07-05 ENCOUNTER — Telehealth: Payer: Self-pay

## 2020-07-05 NOTE — Telephone Encounter (Signed)
RF request for escitalopram 20mg  LOV:01/30/20 Next ov: n/a Last written: 07/23/2019 (90,1)   Please advise

## 2020-07-08 MED ORDER — ESCITALOPRAM OXALATE 20 MG PO TABS
20.0000 mg | ORAL_TABLET | Freq: Every day | ORAL | 0 refills | Status: DC
Start: 2020-07-08 — End: 2020-08-20

## 2020-07-08 NOTE — Telephone Encounter (Signed)
Per her last office visit note for her anxiety which was 07/23/2019 she was to follow-up in 6 months on her anxiety. Please schedule patient for appointment as soon as possible for follow-up.  I have called in 30 days of her prescription, will need to be seen for additional refill.

## 2020-07-08 NOTE — Telephone Encounter (Signed)
Left VM for CB. Pt needs to schedule f/u for anxiety

## 2020-07-08 NOTE — Addendum Note (Signed)
Addended by: Felix Pacini A on: 07/08/2020 12:36 PM   Modules accepted: Orders

## 2020-07-09 NOTE — Telephone Encounter (Signed)
Pt was scheduled for 08/20/20

## 2020-07-24 ENCOUNTER — Other Ambulatory Visit: Payer: Self-pay

## 2020-07-24 ENCOUNTER — Ambulatory Visit (INDEPENDENT_AMBULATORY_CARE_PROVIDER_SITE_OTHER): Payer: 59

## 2020-07-24 DIAGNOSIS — E559 Vitamin D deficiency, unspecified: Secondary | ICD-10-CM

## 2020-07-24 DIAGNOSIS — E0500 Thyrotoxicosis with diffuse goiter without thyrotoxic crisis or storm: Secondary | ICD-10-CM | POA: Diagnosis not present

## 2020-07-24 DIAGNOSIS — Z1239 Encounter for other screening for malignant neoplasm of breast: Secondary | ICD-10-CM | POA: Diagnosis not present

## 2020-08-03 ENCOUNTER — Other Ambulatory Visit: Payer: Self-pay | Admitting: Family Medicine

## 2020-08-19 ENCOUNTER — Other Ambulatory Visit: Payer: Self-pay

## 2020-08-20 ENCOUNTER — Ambulatory Visit (INDEPENDENT_AMBULATORY_CARE_PROVIDER_SITE_OTHER): Payer: 59 | Admitting: Family Medicine

## 2020-08-20 ENCOUNTER — Encounter: Payer: Self-pay | Admitting: Family Medicine

## 2020-08-20 VITALS — BP 98/65 | HR 75 | Temp 98.3°F | Ht 61.0 in | Wt 117.0 lb

## 2020-08-20 DIAGNOSIS — E0590 Thyrotoxicosis, unspecified without thyrotoxic crisis or storm: Secondary | ICD-10-CM

## 2020-08-20 DIAGNOSIS — Z0000 Encounter for general adult medical examination without abnormal findings: Secondary | ICD-10-CM

## 2020-08-20 DIAGNOSIS — Z1159 Encounter for screening for other viral diseases: Secondary | ICD-10-CM

## 2020-08-20 DIAGNOSIS — G43709 Chronic migraine without aura, not intractable, without status migrainosus: Secondary | ICD-10-CM

## 2020-08-20 DIAGNOSIS — Z130 Encounter for screening for diseases of the blood and blood-forming organs and certain disorders involving the immune mechanism: Secondary | ICD-10-CM | POA: Diagnosis not present

## 2020-08-20 DIAGNOSIS — Z131 Encounter for screening for diabetes mellitus: Secondary | ICD-10-CM | POA: Diagnosis not present

## 2020-08-20 DIAGNOSIS — Z13220 Encounter for screening for lipoid disorders: Secondary | ICD-10-CM

## 2020-08-20 DIAGNOSIS — E559 Vitamin D deficiency, unspecified: Secondary | ICD-10-CM | POA: Diagnosis not present

## 2020-08-20 DIAGNOSIS — Z1211 Encounter for screening for malignant neoplasm of colon: Secondary | ICD-10-CM

## 2020-08-20 DIAGNOSIS — F411 Generalized anxiety disorder: Secondary | ICD-10-CM

## 2020-08-20 DIAGNOSIS — E0500 Thyrotoxicosis with diffuse goiter without thyrotoxic crisis or storm: Secondary | ICD-10-CM

## 2020-08-20 DIAGNOSIS — Z79899 Other long term (current) drug therapy: Secondary | ICD-10-CM

## 2020-08-20 DIAGNOSIS — Z9289 Personal history of other medical treatment: Secondary | ICD-10-CM | POA: Insufficient documentation

## 2020-08-20 LAB — CBC WITH DIFFERENTIAL/PLATELET
Basophils Absolute: 0 10*3/uL (ref 0.0–0.1)
Basophils Relative: 0.5 % (ref 0.0–3.0)
Eosinophils Absolute: 0 10*3/uL (ref 0.0–0.7)
Eosinophils Relative: 0.1 % (ref 0.0–5.0)
HCT: 44.4 % (ref 36.0–46.0)
Hemoglobin: 15.4 g/dL — ABNORMAL HIGH (ref 12.0–15.0)
Lymphocytes Relative: 27.9 % (ref 12.0–46.0)
Lymphs Abs: 1.4 10*3/uL (ref 0.7–4.0)
MCHC: 34.6 g/dL (ref 30.0–36.0)
MCV: 91.8 fl (ref 78.0–100.0)
Monocytes Absolute: 0.3 10*3/uL (ref 0.1–1.0)
Monocytes Relative: 5.7 % (ref 3.0–12.0)
Neutro Abs: 3.4 10*3/uL (ref 1.4–7.7)
Neutrophils Relative %: 65.8 % (ref 43.0–77.0)
Platelets: 216 10*3/uL (ref 150.0–400.0)
RBC: 4.84 Mil/uL (ref 3.87–5.11)
RDW: 12.9 % (ref 11.5–15.5)
WBC: 5.1 10*3/uL (ref 4.0–10.5)

## 2020-08-20 LAB — LIPID PANEL
Cholesterol: 185 mg/dL (ref 0–200)
HDL: 73.3 mg/dL (ref >39.00)
LDL Cholesterol: 99 mg/dL (ref 0–99)
NonHDL: 112.18
Total CHOL/HDL Ratio: 3
Triglycerides: 64 mg/dL (ref 0.0–149.0)
VLDL: 12.8 mg/dL (ref 0.0–40.0)

## 2020-08-20 LAB — COMPREHENSIVE METABOLIC PANEL
ALT: 12 U/L (ref 0–35)
AST: 20 U/L (ref 0–37)
Albumin: 4.6 g/dL (ref 3.5–5.2)
Alkaline Phosphatase: 43 U/L (ref 39–117)
BUN: 7 mg/dL (ref 6–23)
CO2: 29 mEq/L (ref 19–32)
Calcium: 9.2 mg/dL (ref 8.4–10.5)
Chloride: 103 mEq/L (ref 96–112)
Creatinine, Ser: 0.73 mg/dL (ref 0.40–1.20)
GFR: 98.94 mL/min (ref >60.00)
Glucose, Bld: 83 mg/dL (ref 70–99)
Potassium: 3.7 mEq/L (ref 3.5–5.1)
Sodium: 139 mEq/L (ref 135–145)
Total Bilirubin: 0.8 mg/dL (ref 0.2–1.2)
Total Protein: 6.9 g/dL (ref 6.0–8.3)

## 2020-08-20 LAB — HEMOGLOBIN A1C: Hgb A1c MFr Bld: 5.2 % (ref 4.6–6.5)

## 2020-08-20 LAB — T4, FREE: Free T4: 0.89 ng/dL (ref 0.60–1.60)

## 2020-08-20 LAB — T3, FREE: T3, Free: 3.3 pg/mL (ref 2.3–4.2)

## 2020-08-20 LAB — VITAMIN D 25 HYDROXY (VIT D DEFICIENCY, FRACTURES): VITD: 31.81 ng/mL (ref 30.00–100.00)

## 2020-08-20 LAB — TSH: TSH: 1.49 u[IU]/mL (ref 0.35–4.50)

## 2020-08-20 MED ORDER — ELETRIPTAN HYDROBROMIDE 20 MG PO TABS
ORAL_TABLET | ORAL | 11 refills | Status: DC
Start: 2020-08-20 — End: 2021-08-25

## 2020-08-20 MED ORDER — ESCITALOPRAM OXALATE 20 MG PO TABS
20.0000 mg | ORAL_TABLET | Freq: Every day | ORAL | 1 refills | Status: DC
Start: 2020-08-20 — End: 2021-03-21

## 2020-08-20 NOTE — Progress Notes (Signed)
This visit occurred during the SARS-CoV-2 public health emergency.  Safety protocols were in place, including screening questions prior to the visit, additional usage of staff PPE, and extensive cleaning of exam room while observing appropriate contact time as indicated for disinfecting solutions.    Patient ID: Amy Delacruz, female  DOB: July 14, 1976, 44 y.o.   MRN: [id number] Patient Care Team    Relationship Specialty Notifications Start End  Amy Leatherwood, DO PCP - General Family Medicine  10/06/16   Amy Fillers, MD    10/07/16    Comment: endocrine  Amy Delacruz, Amy Gaw, MD Referring Physician Dermatology  10/07/16   Amy Bossier, MD Consulting Physician Obstetrics and Gynecology  10/07/16     Chief Complaint  Patient presents with  . Annual Exam    pt is fasting    Subjective:  Amy Delacruz is a 44 y.o.  Female  present for CPE. All past medical history, surgical history, allergies, family history, immunizations, medications and social history were updated in the electronic medical record today. All recent labs, ED visits and hospitalizations within the last year were reviewed.  Anxiety:  Feeling well  on Lexapro 20 mg Qd.   She states she still would eventually like to come off medication but does not think this is a good time. Migraine:  Patient reports migraines are stable on Relpax Health maintenance:  Colonoscopy:No fx, screen at 43> referral placed today Mammogram: completed:06/2020. [facility] Cervical cancer screening: last pap:05/2020 HPV completed by:Amy Delacruz. Rpt 3-5 yrs, Immunizations: tdapUTD 12/2014, InfluenzaUTD 07/2020 (encouraged yearly), covid series completed Infectious disease screening: HIVcompleted  DEXA:DEXA 07/24/2020 normal. Assistive device: none Oxygen JOI:NOMV Patient has a Dental home. Hospitalizations/ED visits: reviewed   Depression screen [facility] 2/9 08/20/2020 07/21/2019 06/24/2018 06/23/2017 10/06/2016  Decreased Interest 0 0 0  - 1  Down, Depressed, Hopeless 0 0 0 1 0  PHQ - 2 Score 0 0 0 1 1  Altered sleeping - - 1 0 -  Tired, decreased energy - - 1 1 -  Change in appetite - - 0 0 -  Feeling bad or failure about yourself  - - 0 0 -  Trouble concentrating - - 0 0 -  Moving slowly or fidgety/restless - - 0 1 -  Suicidal thoughts - - 0 0 -  PHQ-9 Score - - 2 3 -  Difficult doing work/chores - - Somewhat difficult - -   GAD 7 : Generalized Anxiety Score 07/21/2019 06/24/2018 10/06/2016  Nervous, Anxious, on Edge 2 1 3   Control/stop worrying 0 2 3  Worry too much - different things 3 1 3   Trouble relaxing 3 2 0  Restless 3 0 0  Easily annoyed or irritable 1 1 2   Afraid - awful might happen 1 1 2   Total GAD 7 Score 13 8 13   Anxiety Difficulty Very difficult Somewhat difficult Not difficult at all    Immunization History  Administered Date(s) Administered  . Influenza,inj,Quad PF,6+ Mos 08/03/2017  . Influenza,inj,quad, With Preservative 07/03/2018, 07/09/2019  . Influenza-Unspecified 06/27/2015, 06/26/2016, 07/28/2020  . PFIZER SARS-COV-2 Vaccination 01/21/2020, 02/11/2020  . Tdap 12/05/2010, 01/02/2015     Past Medical History:  Diagnosis Date  . Abnormal Pap smear of cervix 2007  . Allergy   . Depression with anxiety 2007  . GERD (gastroesophageal reflux disease)   . Graves disease   . Graves' ophthalmopathy    follows with ophthalmology  . Hearing difficulty of both ears 01/30/2020  .  History of positive PPD    cxr negative  . Lupus erythematosus    managed by dermatology   . Migraine   . Nonspecific reaction to 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 Overview:  neg CXR s/p tx  . Perioral dermatitis 12/13/2017  . Rosacea   . Scoliosis   . Seasonal allergies   . Urticaria 08/2016  . Varicella   . Vitamin D deficiency    Allergies  Allergen Reactions  . Other Hives    Mandarin oranges   Past Surgical  History:  Procedure Laterality Date  . DILATION AND CURETTAGE OF UTERUS  2004   2004,2007  . SKIN LESION EXCISION    . TONSILLECTOMY  1978  . WISDOM TOOTH EXTRACTION  2000   Family History  Problem Relation Age of Onset  . Breast cancer Paternal Grandmother 19  . Hypertension Mother   . Lung disease Mother   . Hypertension Father   . Bleeding Disorder Father   . Stroke Maternal Grandmother   . Hypertension Maternal Grandmother   . Depression Sister   . Depression Maternal Aunt    Social History   Social History Narrative   Married to Amy Delacruz. 2 children Amy Delacruz and Amy Delacruz.    SAHM. Post grad education.    Nonsmoker.    Drinks caffeine, takes a daily vitamin   Wears seatbelt, bicycle helmet, exercises routinely.    Smoke detector in the home.     Allergies as of 08/20/2020      Reactions   Other Hives   Mandarin oranges      Medication List       Accurate as of August 20, 2020 11:59 PM. If you have any questions, ask your nurse or doctor.        eletriptan 20 MG tablet Commonly known as: RELPAX TAKE ONE TABLET BY MOUTH AT ONSET OF HEADACHE AND MAY REPEAT IN 2 HOURS IF NEEDED   escitalopram 20 MG tablet Commonly known as: LEXAPRO Take 1 tablet (20 mg total) by mouth daily.   methimazole 5 MG tablet Commonly known as: TAPAZOLE Take 5 mg by mouth daily.   METRONIDAZOLE (TOPICAL) 0.75 % Lotn APPLY TOPICALLY TWICE DAILY AS DIRECTED   multivitamin tablet Take by mouth.       All past medical history, surgical history, allergies, family history, immunizations andmedications were updated in the EMR today and reviewed under the history and medication portions of their EMR.       ROS: 14 pt review of systems performed and negative (unless mentioned in an HPI)  Objective: BP 98/65   Pulse 75   Temp 98.3 F (36.8 C) (Oral)   Ht 5\' 1"  (1.549 m)   Wt 117 lb (53.1 kg)   SpO2 98%   BMI 22.11 kg/m  Gen: Afebrile. No acute distress. Nontoxic in appearance,  well-developed, well-nourished, pleasant, Caucasian female. HENT: AT. [place]. Bilateral TM visualized and normal in appearance, normal external auditory canal. MMM, no oral lesions, adequate dentition. Bilateral nares within normal limits. Throat without erythema, ulcerations or exudates.  No cough on exam, no hoarseness on exam. Eyes:Pupils Equal Round Reactive to light, Extraocular movements intact,  Conjunctiva without redness, discharge or icterus. Neck/lymp/endocrine: Supple, no lymphadenopathy, no thyromegaly CV: RRR no murmur, no edema, +2/4 P posterior tibialis pulses.  Chest: CTAB, no wheeze, rhonchi or crackles.  Normal respiratory effort.  Good air movement. Abd: Soft.  Flat.  NTND. BS present.  No masses palpated. No hepatosplenomegaly. No rebound tenderness or guarding. Skin: No rashes, purpura or petechiae. Warm and well-perfused. Skin intact. Neuro/Msk:  Normal gait. PERLA. EOMi. Alert. Oriented x3.  Cranial nerves II through XII intact. Muscle strength 5/5 upper/lower extremity. DTRs equal bilaterally. Psych: Normal affect, dress and demeanor. Normal speech. Normal thought content and judgment.   No exam data present  Assessment/plan: Falesha D Demars is a 44 y.o. female present for  CPE  Vitamin D deficiency Bone density normal 06/2020 Continue vitamin D supplementation - Vitamin D (25 hydroxy)  Chronic migraine without aura without status migrainosus, not intractable Stable Continue Relpax as needed  Lipid screening - Lipid panel  Hyperthyroidism/Graves' disease -Tapazole managed by endocrine - TSH - T3, free - T4, free  Diabetes mellitus screening - Hemoglobin A1c Screening for deficiency anemia - CBC with Differential/Platelet Encounter for long-term current use of medication - Comprehensive metabolic panel Colon cancer screening - Ambulatory referral to Gastroenterology  Anxiety state Stable.  Continue lexapro 20 mg qd F/u 5.5 mos prior to needing refills.    Need for hepatitis C screening test - Hepatitis C Antibody> patient agreeable to testing will see if we can add onto current lab.  Encounter for preventative adult health care examination Patient was encouraged to exercise greater than 150 minutes a week. Patient was encouraged to choose a diet filled with fresh fruits and vegetables, and lean meats. AVS provided to patient today for education/recommendation on gender specific health and safety maintenance. Colonoscopy:No fx, screen at 43> referral placed today Mammogram: completed:06/2020. [facility] Cervical cancer screening: last pap:05/2020 HPV completed by:Amy Delacruz. Rpt 3-5 yrs, Immunizations: tdapUTD 12/2014, InfluenzaUTD 07/2020 (encouraged yearly), covid series completed Infectious disease screening: HIVcompleted  DEXA:DEXA 07/24/2020 normal.  Return in about 24 weeks (around 02/04/2021) for [facility] (30 min) and 1 year CPE.  Orders Placed This Encounter  Procedures  . CBC with Differential/Platelet  . Comprehensive metabolic panel  . Hemoglobin A1c  . Lipid panel  . TSH  . T3, free  . T4, free  . Vitamin D (25 hydroxy)  . Hepatitis C Antibody  . Ambulatory referral to Gastroenterology   Meds ordered this encounter  Medications  . eletriptan (RELPAX) 20 MG tablet    Sig: TAKE ONE TABLET BY MOUTH AT ONSET OF HEADACHE AND MAY REPEAT IN 2 HOURS IF NEEDED    Dispense:  6 tablet    Refill:  11  . escitalopram (LEXAPRO) 20 MG tablet    Sig: Take 1 tablet (20 mg total) by mouth daily.    Dispense:  90 tablet    Refill:  1    Referral Orders     Ambulatory referral to Gastroenterology   Electronically signed by: Felix Pacini, DO  [facility]- [place]

## 2020-08-20 NOTE — Patient Instructions (Signed)
Health Maintenance, Female Adopting a healthy lifestyle and getting preventive care are important in promoting health and wellness. Ask your health care provider about:  The right schedule for you to have regular tests and exams.  Things you can do on your own to prevent diseases and keep yourself healthy. What should I know about diet, weight, and exercise? Eat a healthy diet   Eat a diet that includes plenty of vegetables, fruits, low-fat dairy products, and lean protein.  Do not eat a lot of foods that are high in solid fats, added sugars, or sodium. Maintain a healthy weight Body mass index (BMI) is used to identify weight problems. It estimates body fat based on height and weight. Your health care provider can help determine your BMI and help you achieve or maintain a healthy weight. Get regular exercise Get regular exercise. This is one of the most important things you can do for your health. Most adults should:  Exercise for at least 150 minutes each week. The exercise should increase your heart rate and make you sweat (moderate-intensity exercise).  Do strengthening exercises at least twice a week. This is in addition to the moderate-intensity exercise.  Spend less time sitting. Even light physical activity can be beneficial. Watch cholesterol and blood lipids Have your blood tested for lipids and cholesterol at 46 years of age, then have this test every 5 years. Have your cholesterol levels checked more often if:  Your lipid or cholesterol levels are high.  You are older than 46 years of age.  You are at high risk for heart disease. What should I know about cancer screening? Depending on your health history and family history, you may need to have cancer screening at various ages. This may include screening for:  Breast cancer.  Cervical cancer.  Colorectal cancer.  Skin cancer.  Lung cancer. What should I know about heart disease, diabetes, and high blood  pressure? Blood pressure and heart disease  High blood pressure causes heart disease and increases the risk of stroke. This is more likely to develop in people who have high blood pressure readings, are of African descent, or are overweight.  Have your blood pressure checked: ? Every 3-5 years if you are 18-39 years of age. ? Every year if you are 40 years old or older. Diabetes Have regular diabetes screenings. This checks your fasting blood sugar level. Have the screening done:  Once every three years after age 40 if you are at a normal weight and have a low risk for diabetes.  More often and at a younger age if you are overweight or have a high risk for diabetes. What should I know about preventing infection? Hepatitis B If you have a higher risk for hepatitis B, you should be screened for this virus. Talk with your health care provider to find out if you are at risk for hepatitis B infection. Hepatitis C Testing is recommended for:  Everyone born from 1945 through 1965.  Anyone with known risk factors for hepatitis C. Sexually transmitted infections (STIs)  Get screened for STIs, including gonorrhea and chlamydia, if: ? You are sexually active and are younger than 46 years of age. ? You are older than 46 years of age and your health care provider tells you that you are at risk for this type of infection. ? Your sexual activity has changed since you were last screened, and you are at increased risk for chlamydia or gonorrhea. Ask your health care provider if   you are at risk.  Ask your health care provider about whether you are at high risk for HIV. Your health care provider may recommend a prescription medicine to help prevent HIV infection. If you choose to take medicine to prevent HIV, you should first get tested for HIV. You should then be tested every 3 months for as long as you are taking the medicine. Pregnancy  If you are about to stop having your period (premenopausal) and  you may become pregnant, seek counseling before you get pregnant.  Take 400 to 800 micrograms (mcg) of folic acid every day if you become pregnant.  Ask for birth control (contraception) if you want to prevent pregnancy. Osteoporosis and menopause Osteoporosis is a disease in which the bones lose minerals and strength with aging. This can result in bone fractures. If you are 65 years old or older, or if you are at risk for osteoporosis and fractures, ask your health care provider if you should:  Be screened for bone loss.  Take a calcium or vitamin D supplement to lower your risk of fractures.  Be given hormone replacement therapy (HRT) to treat symptoms of menopause. Follow these instructions at home: Lifestyle  Do not use any products that contain nicotine or tobacco, such as cigarettes, e-cigarettes, and chewing tobacco. If you need help quitting, ask your health care provider.  Do not use street drugs.  Do not share needles.  Ask your health care provider for help if you need support or information about quitting drugs. Alcohol use  Do not drink alcohol if: ? Your health care provider tells you not to drink. ? You are pregnant, may be pregnant, or are planning to become pregnant.  If you drink alcohol: ? Limit how much you use to 0-1 drink a day. ? Limit intake if you are breastfeeding.  Be aware of how much alcohol is in your drink. In the [place] one drink equals one 12 oz bottle of beer (355 mL), one 5 oz glass of wine (148 mL), or one 1 oz glass of hard liquor (44 mL). General instructions  Schedule regular health, dental, and eye exams.  Stay current with your vaccines.  Tell your health care provider if: ? You often feel depressed. ? You have ever been abused or do not feel safe at home. Summary  Adopting a healthy lifestyle and getting preventive care are important in promoting health and wellness.  Follow your health care provider's instructions about healthy  diet, exercising, and getting tested or screened for diseases.  Follow your health care provider's instructions on monitoring your cholesterol and blood pressure. This information is not intended to replace advice given to you by your health care provider. Make sure you discuss any questions you have with your health care provider. Document Revised: 10/05/2018 Document Reviewed: 10/05/2018 Elsevier Patient Education  2020 [employer]  

## 2020-08-21 ENCOUNTER — Encounter: Payer: Self-pay | Admitting: Family Medicine

## 2020-08-22 LAB — HEPATITIS C ANTIBODY
Hepatitis C Ab: NONREACTIVE
SIGNAL TO CUT-OFF: 0.01 (ref <1.00)

## 2020-11-25 ENCOUNTER — Other Ambulatory Visit: Payer: Self-pay | Admitting: Family Medicine

## 2020-12-05 ENCOUNTER — Other Ambulatory Visit (HOSPITAL_COMMUNITY)
Admission: RE | Admit: 2020-12-05 | Discharge: 2020-12-05 | Disposition: A | Discharge status: Home / Self Care | Payer: 59 | Source: Ambulatory Visit | Attending: Obstetrics and Gynecology | Admitting: Obstetrics and Gynecology

## 2020-12-05 ENCOUNTER — Other Ambulatory Visit: Payer: Self-pay

## 2020-12-05 ENCOUNTER — Ambulatory Visit: Payer: 59 | Admitting: Obstetrics and Gynecology

## 2020-12-05 ENCOUNTER — Ambulatory Visit (INDEPENDENT_AMBULATORY_CARE_PROVIDER_SITE_OTHER): Payer: 59

## 2020-12-05 ENCOUNTER — Encounter: Payer: Self-pay | Admitting: Obstetrics and Gynecology

## 2020-12-05 VITALS — BP 108/76 | HR 78 | Ht 60.0 in | Wt 118.0 lb

## 2020-12-05 DIAGNOSIS — N83201 Unspecified ovarian cyst, right side: Secondary | ICD-10-CM

## 2020-12-05 DIAGNOSIS — N939 Abnormal uterine and vaginal bleeding, unspecified: Secondary | ICD-10-CM

## 2020-12-05 DIAGNOSIS — N898 Other specified noninflammatory disorders of vagina: Secondary | ICD-10-CM | POA: Diagnosis not present

## 2020-12-05 NOTE — Progress Notes (Signed)
45 yo P2 with LMP 11/12/20 presenting today for the evaluation of abnormal uterine bleeding and intermittent right lower quadrant pain. Patient reports normal monthly cycle of 3-5 days but her most recent period started on 11/12/20 and ended on 2/6. Patient reports this is not her normal pattern. She reports an elevated FSH level and recent abnormal labs related to her Graves's disease with endocrinologist and is scheduled to follow up with him on that matter. Patient also reports intermittent RLQ pain which has been present for the past 2 weeks. The pain is worst with excessive movements. Patient is otherwise without any other complaints. She is sexually active with her husband without complaints.   Past Medical History:  Diagnosis Date  . Abnormal Pap smear of cervix 2007  . Allergy   . Depression with anxiety 2007  . GERD (gastroesophageal reflux disease)   . Graves disease   . Graves' ophthalmopathy    follows with ophthalmology  . Hearing difficulty of both ears 01/30/2020  . History of positive PPD    cxr negative  . Lupus erythematosus    managed by dermatology   . Migraine   . Nonspecific reaction to 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 Overview:  neg CXR s/p tx  . Perioral dermatitis 12/13/2017  . Rosacea   . Scoliosis   . Seasonal allergies   . Urticaria 08/2016  . Varicella   . Vitamin D deficiency    Past Surgical History:  Procedure Laterality Date  . DILATION AND CURETTAGE OF UTERUS  2004   2004,2007  . SKIN LESION EXCISION    . TONSILLECTOMY  1978  . WISDOM TOOTH EXTRACTION  2000   Family History  Problem Relation Age of Onset  . Breast cancer Paternal Grandmother 13  . Hypertension Mother   . Lung disease Mother   . Hypertension Father   . Bleeding Disorder Father   . Stroke Maternal Grandmother   . Hypertension Maternal Grandmother   . Depression Sister   . Depression  Maternal Aunt    Social History   Tobacco Use  . Smoking status: Never Smoker  . Smokeless tobacco: Never Used  Vaping Use  . Vaping Use: Never used  Substance Use Topics  . Alcohol use: Yes    Alcohol/week: 4.0 standard drinks    Types: 4 Cans of beer per week  . Drug use: No   ROS See pertinent in HPI. All other systems reviewed and non contributory GENERAL: Well-developed, well-nourished female in no acute distress.  LUNGS: Clear to auscultation bilaterally.  HEART: Regular rate and rhythm. ABDOMEN: Soft, nontender, nondistended. No organomegaly. PELVIC: Normal external female genitalia. Vagina is pink and rugated.  Normal discharge. Normal appearing cervix. Uterus is normal in size. No adnexal mass or tenderness. EXTREMITIES: No cyanosis, clubbing, or edema, 2+ distal pulses.  A/P 45 yo P2 with AUB and lower abdominal pain - wet prep collected - pelvic ultrasound ordered - Reassurance provided and patient advised to keep a menstrual calendar - discussed management with contraception for cycle control and endometrial ablation assuming all lab and radiologic findings are normal and AUB persists - patient verbalized understanding and all questions were answered

## 2020-12-05 NOTE — Progress Notes (Signed)
LMP 1/18 lasted for 2 1/2 weeks

## 2020-12-06 LAB — CERVICOVAGINAL ANCILLARY ONLY
Bacterial Vaginitis (gardnerella): NEGATIVE
Candida Glabrata: NEGATIVE
Candida Vaginitis: POSITIVE — AB
Comment: NEGATIVE
Comment: NEGATIVE
Comment: NEGATIVE

## 2020-12-09 MED ORDER — FLUCONAZOLE 150 MG PO TABS: 150.0000 mg | ORAL_TABLET | Freq: Once | ORAL | 0 refills | Status: AC

## 2020-12-09 NOTE — Addendum Note (Signed)
Addended by: Catalina Antigua on: 12/09/2020 08:29 AM   Modules accepted: Orders

## 2021-03-21 ENCOUNTER — Other Ambulatory Visit: Payer: Self-pay

## 2021-03-21 ENCOUNTER — Ambulatory Visit: Payer: 59 | Admitting: Sports Medicine

## 2021-03-21 ENCOUNTER — Ambulatory Visit (INDEPENDENT_AMBULATORY_CARE_PROVIDER_SITE_OTHER): Payer: 59

## 2021-03-21 DIAGNOSIS — M545 Low back pain, unspecified: Secondary | ICD-10-CM | POA: Diagnosis not present

## 2021-03-21 DIAGNOSIS — G8929 Other chronic pain: Secondary | ICD-10-CM

## 2021-03-21 DIAGNOSIS — M47816 Spondylosis without myelopathy or radiculopathy, lumbar region: Secondary | ICD-10-CM | POA: Insufficient documentation

## 2021-03-21 DIAGNOSIS — R1030 Lower abdominal pain, unspecified: Secondary | ICD-10-CM | POA: Diagnosis not present

## 2021-03-21 MED ORDER — PREDNISONE 50 MG PO TABS: ORAL_TABLET | ORAL | 0 refills | Status: DC

## 2021-03-21 MED ORDER — GABAPENTIN 300 MG PO CAPS: ORAL_CAPSULE | ORAL | 3 refills | Status: DC

## 2021-03-21 NOTE — Progress Notes (Signed)
    Procedures performed today:    None.  Independent interpretation of notes and tests performed by another provider:   None.  Brief History, Exam, Impression, and Recommendations:    Low back pain This is a pleasant 45 year old female with history of idiopathic scoliosis, for the past several months she has had pain in her low back, left-sided with radiation around to the left iliac crest. No changes in activity, no trauma, no constitutional symptoms, up-to-date on screenings. She has a history of sciatica, and has been doing home physical therapy for greater than 6 weeks now. Pain is worse with sitting, flexion, Valsalva and she does have discrete tenderness over her left iliac crest, she also has reproduction of pain with internal rotation of the left hip. Her symptomatology is consistent with both discogenic pain generator and a hip joint pain generator, I would like updated lumbar spine x-rays as well as a lumbar spine MRI considering greater than 6 weeks of failed conservative treatment. I would like x-rays of her hip with attention placed on the joint and her left iliac crest, we will also do 5 days of prednisone, gabapentin in an up taper. Return to see me in 4 weeks.    ___________________________________________ Ihor Austin. Benjamin Stain, M.D., ABFM., CAQSM. Primary Care and Sports Medicine [facility]  Adjunct Instructor of Family Medicine  [employer]

## 2021-03-21 NOTE — Assessment & Plan Note (Signed)
This is a pleasant 45 year old female with history of idiopathic scoliosis, for the past several months she has had pain in her low back, left-sided with radiation around to the left iliac crest. No changes in activity, no trauma, no constitutional symptoms, up-to-date on screenings. She has a history of sciatica, and has been doing home physical therapy for greater than 6 weeks now. Pain is worse with sitting, flexion, Valsalva and she does have discrete tenderness over her left iliac crest, she also has reproduction of pain with internal rotation of the left hip. Her symptomatology is consistent with both discogenic pain generator and a hip joint pain generator, I would like updated lumbar spine x-rays as well as a lumbar spine MRI considering greater than 6 weeks of failed conservative treatment. I would like x-rays of her hip with attention placed on the joint and her left iliac crest, we will also do 5 days of prednisone, gabapentin in an up taper. Return to see me in 4 weeks.

## 2021-03-23 ENCOUNTER — Other Ambulatory Visit: Payer: Self-pay

## 2021-03-23 ENCOUNTER — Ambulatory Visit (INDEPENDENT_AMBULATORY_CARE_PROVIDER_SITE_OTHER): Payer: 59

## 2021-03-23 DIAGNOSIS — G8929 Other chronic pain: Secondary | ICD-10-CM | POA: Diagnosis not present

## 2021-03-23 DIAGNOSIS — M545 Low back pain, unspecified: Secondary | ICD-10-CM

## 2021-03-31 DIAGNOSIS — M545 Low back pain, unspecified: Secondary | ICD-10-CM

## 2021-03-31 DIAGNOSIS — G8929 Other chronic pain: Secondary | ICD-10-CM

## 2021-04-01 NOTE — Telephone Encounter (Signed)
I spent 5 total minutes of online digital evaluation and management services. 

## 2021-04-18 ENCOUNTER — Ambulatory Visit: Payer: 59 | Admitting: Sports Medicine

## 2021-04-18 ENCOUNTER — Other Ambulatory Visit: Payer: Self-pay

## 2021-04-18 DIAGNOSIS — M47816 Spondylosis without myelopathy or radiculopathy, lumbar region: Secondary | ICD-10-CM

## 2021-04-18 NOTE — Progress Notes (Signed)
    Procedures performed today:    None.  Independent interpretation of notes and tests performed by another provider:   Lumbar spine MRI personally reviewed, multilevel mild noncompressive degenerative processes with mild scoliosis, right-sided facet cyst.  Brief History, Exam, Impression, and Recommendations:    Lumbar spondylosis with scoliosis This is a pleasant 45 year old female, she is very mild lumbar spondylosis with idiopathic scoliosis, mild multilevel degenerative changes, noncompressive. There is a right-sided facet synovial cyst in the mid to upper lumbar spine that is simply incidental. Gabapentin has worked very well, 1 pill or so every now and then seems to control her symptoms well. Ultimately we can continue this indefinitely, return to see me as needed.    ___________________________________________ Ihor Austin. Benjamin Stain, M.D., ABFM., CAQSM. Primary Care and Sports Medicine [facility]  Adjunct Instructor of Family Medicine  [employer]

## 2021-04-18 NOTE — Assessment & Plan Note (Signed)
This is a pleasant 45 year old female, she is very mild lumbar spondylosis with idiopathic scoliosis, mild multilevel degenerative changes, noncompressive. There is a right-sided facet synovial cyst in the mid to upper lumbar spine that is simply incidental. Gabapentin has worked very well, 1 pill or so every now and then seems to control her symptoms well. Ultimately we can continue this indefinitely, return to see me as needed.

## 2021-05-29 LAB — HM COLONOSCOPY

## 2021-06-10 ENCOUNTER — Other Ambulatory Visit: Payer: Self-pay | Admitting: Family Medicine

## 2021-06-10 DIAGNOSIS — Z1231 Encounter for screening mammogram for malignant neoplasm of breast: Secondary | ICD-10-CM

## 2021-07-31 ENCOUNTER — Other Ambulatory Visit: Payer: Self-pay

## 2021-07-31 ENCOUNTER — Ambulatory Visit (INDEPENDENT_AMBULATORY_CARE_PROVIDER_SITE_OTHER): Payer: 59

## 2021-07-31 DIAGNOSIS — Z1231 Encounter for screening mammogram for malignant neoplasm of breast: Secondary | ICD-10-CM

## 2021-08-25 ENCOUNTER — Ambulatory Visit (INDEPENDENT_AMBULATORY_CARE_PROVIDER_SITE_OTHER): Payer: 59 | Admitting: Family Medicine

## 2021-08-25 ENCOUNTER — Other Ambulatory Visit: Payer: Self-pay

## 2021-08-25 ENCOUNTER — Encounter: Payer: Self-pay | Admitting: Family Medicine

## 2021-08-25 VITALS — BP 99/69 | HR 74 | Temp 97.7°F | Ht 61.0 in | Wt 114.0 lb

## 2021-08-25 DIAGNOSIS — Z0000 Encounter for general adult medical examination without abnormal findings: Secondary | ICD-10-CM

## 2021-08-25 DIAGNOSIS — G43709 Chronic migraine without aura, not intractable, without status migrainosus: Secondary | ICD-10-CM

## 2021-08-25 DIAGNOSIS — Z130 Encounter for screening for diseases of the blood and blood-forming organs and certain disorders involving the immune mechanism: Secondary | ICD-10-CM | POA: Diagnosis not present

## 2021-08-25 DIAGNOSIS — Z131 Encounter for screening for diabetes mellitus: Secondary | ICD-10-CM | POA: Diagnosis not present

## 2021-08-25 DIAGNOSIS — E559 Vitamin D deficiency, unspecified: Secondary | ICD-10-CM

## 2021-08-25 DIAGNOSIS — E0500 Thyrotoxicosis with diffuse goiter without thyrotoxic crisis or storm: Secondary | ICD-10-CM

## 2021-08-25 DIAGNOSIS — L508 Other urticaria: Secondary | ICD-10-CM | POA: Diagnosis not present

## 2021-08-25 DIAGNOSIS — F411 Generalized anxiety disorder: Secondary | ICD-10-CM | POA: Diagnosis not present

## 2021-08-25 DIAGNOSIS — Z13220 Encounter for screening for lipoid disorders: Secondary | ICD-10-CM

## 2021-08-25 LAB — COMPREHENSIVE METABOLIC PANEL
ALT: 349 U/L — ABNORMAL HIGH (ref 0–35)
AST: 208 U/L — ABNORMAL HIGH (ref 0–37)
Albumin: 4.2 g/dL (ref 3.5–5.2)
Alkaline Phosphatase: 55 U/L (ref 39–117)
BUN: 12 mg/dL (ref 6–23)
CO2: 30 mEq/L (ref 19–32)
Calcium: 8.5 mg/dL (ref 8.4–10.5)
Chloride: 101 mEq/L (ref 96–112)
Creatinine, Ser: 0.76 mg/dL (ref 0.40–1.20)
GFR: 93.6 mL/min (ref >60.00)
Glucose, Bld: 87 mg/dL (ref 70–99)
Potassium: 4.1 mEq/L (ref 3.5–5.1)
Sodium: 138 mEq/L (ref 135–145)
Total Bilirubin: 0.8 mg/dL (ref 0.2–1.2)
Total Protein: 6.5 g/dL (ref 6.0–8.3)

## 2021-08-25 LAB — LIPID PANEL
Cholesterol: 157 mg/dL (ref 0–200)
HDL: 64.4 mg/dL (ref >39.00)
LDL Cholesterol: 85 mg/dL (ref 0–99)
NonHDL: 92.51
Total CHOL/HDL Ratio: 2
Triglycerides: 37 mg/dL (ref 0.0–149.0)
VLDL: 7.4 mg/dL (ref 0.0–40.0)

## 2021-08-25 LAB — CBC
HCT: 41.3 % (ref 36.0–46.0)
Hemoglobin: 13.9 g/dL (ref 12.0–15.0)
MCHC: 33.5 g/dL (ref 30.0–36.0)
MCV: 91.7 fl (ref 78.0–100.0)
Platelets: 209 10*3/uL (ref 150.0–400.0)
RBC: 4.51 Mil/uL (ref 3.87–5.11)
RDW: 13 % (ref 11.5–15.5)
WBC: 5.2 10*3/uL (ref 4.0–10.5)

## 2021-08-25 LAB — HEMOGLOBIN A1C: Hgb A1c MFr Bld: 5.2 % (ref 4.6–6.5)

## 2021-08-25 LAB — VITAMIN D 25 HYDROXY (VIT D DEFICIENCY, FRACTURES): VITD: 47.46 ng/mL (ref 30.00–100.00)

## 2021-08-25 MED ORDER — ESCITALOPRAM OXALATE 20 MG PO TABS: 20.0000 mg | ORAL_TABLET | Freq: Every day | ORAL | 3 refills | Status: DC

## 2021-08-25 MED ORDER — ELETRIPTAN HYDROBROMIDE 20 MG PO TABS: ORAL_TABLET | ORAL | 11 refills | Status: DC

## 2021-08-25 NOTE — Patient Instructions (Signed)
Great to see you today.  I have refilled the medication(s) we provide.   If labs were collected, we will inform you of lab results once received either by echart message or telephone call.   - echart message- for normal results that have been seen by the patient already.   - telephone call: abnormal results or if patient has not viewed results in their echart. Health Maintenance, Female Adopting a healthy lifestyle and getting preventive care are important in promoting health and wellness. Ask your health care provider about: The right schedule for you to have regular tests and exams. Things you can do on your own to prevent diseases and keep yourself healthy. What should I know about diet, weight, and exercise? Eat a healthy diet  Eat a diet that includes plenty of vegetables, fruits, low-fat dairy products, and lean protein. Do not eat a lot of foods that are high in solid fats, added sugars, or sodium. Maintain a healthy weight Body mass index (BMI) is used to identify weight problems. It estimates body fat based on height and weight. Your health care provider can help determine your BMI and help you achieve or maintain a healthy weight. Get regular exercise Get regular exercise. This is one of the most important things you can do for your health. Most adults should: Exercise for at least 150 minutes each week. The exercise should increase your heart rate and make you sweat (moderate-intensity exercise). Do strengthening exercises at least twice a week. This is in addition to the moderate-intensity exercise. Spend less time sitting. Even light physical activity can be beneficial. Watch cholesterol and blood lipids Have your blood tested for lipids and cholesterol at 47 years of age, then have this test every 5 years. Have your cholesterol levels checked more often if: Your lipid or cholesterol levels are high. You are older than 47 years of age. You are at high risk for heart  disease. What should I know about cancer screening? Depending on your health history and family history, you may need to have cancer screening at various ages. This may include screening for: Breast cancer. Cervical cancer. Colorectal cancer. Skin cancer. Lung cancer. What should I know about heart disease, diabetes, and high blood pressure? Blood pressure and heart disease High blood pressure causes heart disease and increases the risk of stroke. This is more likely to develop in people who have high blood pressure readings, are of African descent, or are overweight. Have your blood pressure checked: Every 3-5 years if you are 18-39 years of age. Every year if you are 40 years old or older. Diabetes Have regular diabetes screenings. This checks your fasting blood sugar level. Have the screening done: Once every three years after age 40 if you are at a normal weight and have a low risk for diabetes. More often and at a younger age if you are overweight or have a high risk for diabetes. What should I know about preventing infection? Hepatitis B If you have a higher risk for hepatitis B, you should be screened for this virus. Talk with your health care provider to find out if you are at risk for hepatitis B infection. Hepatitis C Testing is recommended for: Everyone born from 1945 through 1965. Anyone with known risk factors for hepatitis C. Sexually transmitted infections (STIs) Get screened for STIs, including gonorrhea and chlamydia, if: You are sexually active and are younger than 47 years of age. You are older than 47 years of age and your   health care provider tells you that you are at risk for this type of infection. Your sexual activity has changed since you were last screened, and you are at increased risk for chlamydia or gonorrhea. Ask your health care provider if you are at risk. Ask your health care provider about whether you are at high risk for HIV. Your health care provider  may recommend a prescription medicine to help prevent HIV infection. If you choose to take medicine to prevent HIV, you should first get tested for HIV. You should then be tested every 3 months for as long as you are taking the medicine. Pregnancy If you are about to stop having your period (premenopausal) and you may become pregnant, seek counseling before you get pregnant. Take 400 to 800 micrograms (mcg) of folic acid every day if you become pregnant. Ask for birth control (contraception) if you want to prevent pregnancy. Osteoporosis and menopause Osteoporosis is a disease in which the bones lose minerals and strength with aging. This can result in bone fractures. If you are 65 years old or older, or if you are at risk for osteoporosis and fractures, ask your health care provider if you should: Be screened for bone loss. Take a calcium or vitamin D supplement to lower your risk of fractures. Be given hormone replacement therapy (HRT) to treat symptoms of menopause. Follow these instructions at home: Lifestyle Do not use any products that contain nicotine or tobacco, such as cigarettes, e-cigarettes, and chewing tobacco. If you need help quitting, ask your health care provider. Do not use street drugs. Do not share needles. Ask your health care provider for help if you need support or information about quitting drugs. Alcohol use Do not drink alcohol if: Your health care provider tells you not to drink. You are pregnant, may be pregnant, or are planning to become pregnant. If you drink alcohol: Limit how much you use to 0-1 drink a day. Limit intake if you are breastfeeding. Be aware of how much alcohol is in your drink. In the [place] one drink equals one 12 oz bottle of beer (355 mL), one 5 oz glass of wine (148 mL), or one 1 oz glass of hard liquor (44 mL). General instructions Schedule regular health, dental, and eye exams. Stay current with your vaccines. Tell your health care  provider if: You often feel depressed. You have ever been abused or do not feel safe at home. Summary Adopting a healthy lifestyle and getting preventive care are important in promoting health and wellness. Follow your health care provider's instructions about healthy diet, exercising, and getting tested or screened for diseases. Follow your health care provider's instructions on monitoring your cholesterol and blood pressure. This information is not intended to replace advice given to you by your health care provider. Make sure you discuss any questions you have with your health care provider. Document Revised: 12/20/2020 Document Reviewed: 10/05/2018 Elsevier Patient Education  2022 Elsevier Inc.  

## 2021-08-25 NOTE — Progress Notes (Signed)
This visit occurred during the SARS-CoV-2 public health emergency.  Safety protocols were in place, including screening questions prior to the visit, additional usage of staff PPE, and extensive cleaning of exam room while observing appropriate contact time as indicated for disinfecting solutions.    Patient ID: Amy Delacruz, female  DOB: 1976/06/03, 45 y.o.   MRN: [id number] Patient Care Team    Relationship Specialty Notifications Start End  Natalia Leatherwood, DO PCP - General Family Medicine  10/06/16   Warden Fillers, MD    10/07/16    Comment: endocrine  Konrad Penta, MD Referring Physician Gastroenterology  08/25/21   Catalina Antigua, MD Consulting Physician Obstetrics and Gynecology  08/25/21   Kandice Hams, MD Referring Physician Dermatology  08/25/21   Monica Becton, MD Consulting Physician Sports Medicine  08/25/21     Chief Complaint  Patient presents with   Annual Exam    Pt is fasting    Subjective: Amy Delacruz is a 45 y.o.  Female  present for CPE/cmc. All past medical history, surgical history, allergies, family history, immunizations, medications and social history were updated in the electronic medical record today. All recent labs, ED visits and hospitalizations within the last year were reviewed.    Anxiety:  Feelingwell  on Lexapro 20 mg Qd.   SShe is under a great deal of stress right now but feels medicine is good.   Migraine:  Patient reports migraines are stable on Relpax   Health maintenance:  Colonoscopy: No fx, screen at 43>Dr. Bray> 10 yr per pt.  Mammogram: completed: 07/31/2021. [facility] Cervical cancer screening: last pap: 05/2020 HPV completed by: Dr. Marice Potter. Rpt 3-5 yrs, Immunizations: tdap UTD 12/2014, Influenza UTD 07/2021(encouraged yearly), covid series completed Infectious disease screening: HIV completed Hep c completed DEXA: DEXA 07/24/2020 normal. Assistive device: none Oxygen TDH:RCBU Patient has a  Dental home. Hospitalizations/ED visits: reviewed  Depression screen [facility] 2/9 08/25/2021 08/20/2020 07/21/2019 06/24/2018 06/23/2017  Decreased Interest 2 0 0 0 -  Down, Depressed, Hopeless 1 0 0 0 1  PHQ - 2 Score 3 0 0 0 1  Altered sleeping 3 - - 1 0  Tired, decreased energy 2 - - 1 1  Change in appetite 3 - - 0 0  Feeling bad or failure about yourself  0 - - 0 0  Trouble concentrating 1 - - 0 0  Moving slowly or fidgety/restless 0 - - 0 1  Suicidal thoughts 0 - - 0 0  PHQ-9 Score 12 - - 2 3  Difficult doing work/chores - - - Somewhat difficult -   GAD 7 : Generalized Anxiety Score 08/25/2021 07/21/2019 06/24/2018 10/06/2016  Nervous, Anxious, on Edge 2 2 1 3   Control/stop worrying 1 0 2 3  Worry too much - different things 2 3 1 3   Trouble relaxing 2 3 2  0  Restless 2 3 0 0  Easily annoyed or irritable 2 1 1 2   Afraid - awful might happen 1 1 1 2   Total GAD 7 Score 12 13 8 13   Anxiety Difficulty - Very difficult Somewhat difficult Not difficult at all    Immunization History  Administered Date(s) Administered   Influenza,inj,Quad PF,6+ Mos 08/03/2017, 08/02/2021   Influenza,inj,quad, With Preservative 07/03/2018, 07/09/2019   Influenza-Unspecified 06/27/2015, 06/26/2016, 07/28/2020   PFIZER(Purple Top)SARS-COV-2 Vaccination 01/21/2020, 02/11/2020, 09/12/2020   Pfizer Covid-19 Vaccine Bivalent Booster 17yrs & up 07/13/2021   Tdap 12/05/2010, 01/02/2015    Past Medical  History:  Diagnosis Date   Abnormal Pap smear of cervix 2007   Allergy    Depression with anxiety 2007   GERD (gastroesophageal reflux disease)    Graves disease    Graves' ophthalmopathy    follows with ophthalmology   Hearing difficulty of both ears 01/30/2020   History of positive PPD    cxr negative   Lupus erythematosus    managed by dermatology    Migraine    Nonspecific reaction to tuberculin skin test without active tuberculosis 11/06/2014   Formatting of this note might be different from the  original. Overview:  neg CXR s/p tx   Perioral dermatitis 12/13/2017   Rosacea    Scoliosis    Seasonal allergies    Urticaria 08/2016   Varicella    Vitamin D deficiency    Allergies  Allergen Reactions   Other Hives    Mandarin oranges   Past Surgical History:  Procedure Laterality Date   DILATION AND CURETTAGE OF UTERUS  2004   2004,2007   SKIN LESION EXCISION     TONSILLECTOMY  1978   WISDOM TOOTH EXTRACTION  2000   Family History  Problem Relation Age of Onset   Breast cancer Paternal Grandmother 83   Hypertension Mother    Lung disease Mother    Arthritis Mother    Hypertension Father    Bleeding Disorder Father    Stroke Maternal Grandmother    Hypertension Maternal Grandmother    Depression Sister    Depression Maternal Aunt    Social History   Social History Narrative   Married to [place]. 2 children [name] and [place].    SAHM. Post grad education.    Nonsmoker.    Drinks caffeine, takes a daily vitamin   Wears seatbelt, bicycle helmet, exercises routinely.    Smoke detector in the home.     Allergies as of 08/25/2021       Reactions   Other Hives   Mandarin oranges        Medication List        Accurate as of August 25, 2021  9:31 AM. If you have any questions, ask your nurse or doctor.          STOP taking these medications    gabapentin 300 MG capsule Commonly known as: NEURONTIN Stopped by: Felix Pacini, DO       TAKE these medications    eletriptan 20 MG tablet Commonly known as: RELPAX TAKE ONE TABLET BY MOUTH AT ONSET OF HEADACHE AND MAY REPEAT IN 2 HOURS IF NEEDED   escitalopram 20 MG tablet Commonly known as: LEXAPRO Take 1 tablet (20 mg total) by mouth daily.   METRONIDAZOLE (TOPICAL) 0.75 % Lotn APPLY TOPICALLY TWICE DAILY AS DIRECTED   multivitamin tablet Take by mouth.        All past medical history, surgical history, allergies, family history, immunizations andmedications were updated in the EMR today  and reviewed under the history and medication portions of their EMR.     No results found for this or any previous visit (from the past 2160 hour(s)).  ROS: 14 pt review of systems performed and negative (unless mentioned in an HPI)  Objective: BP 99/69   Pulse 74   Temp 97.7 F (36.5 C) (Oral)   Ht 5\' 1"  (1.549 m)   Wt 114 lb (51.7 kg)   SpO2 100%   BMI 21.54 kg/m  Gen: Afebrile. No acute distress. Nontoxic in appearance, well-developed, well-nourished,  pleasant female.  HENT: AT. [facility] [place]. Bilateral TM visualized and normal in appearance, normal external auditory canal. MMM, no oral lesions, adequate dentition. Bilateral nares within normal limits. Throat without erythema, ulcerations or exudates. no Cough on exam, no hoarseness on exam. Eyes:Pupils Equal Round Reactive to light, Extraocular movements intact,  Conjunctiva without redness, discharge or icterus. Neck/lymp/endocrine: Supple,no lymphadenopathy, no thyromegaly CV: RRR no murmur, no edema, +2/4 P posterior tibialis pulses.  Chest: CTAB, no wheeze, rhonchi or crackles. normal Respiratory effort. good Air movement. Abd: Soft. flat. NTND. BS present. no Masses palpated. No hepatosplenomegaly. No rebound tenderness or guarding. Skin: no rashes, purpura or petechiae. Warm and well-perfused. Skin intact. Neuro/Msk: Normal gait. PERLA. EOMi. Alert. Oriented x3.  Cranial nerves II through XII intact. Muscle strength 5/5 upper/lower extremity. DTRs equal bilaterally. Psych: Normal affect, dress and demeanor. Normal speech. Normal thought content and judgment.   No results found.  Assessment/plan: Amy Delacruz is a 45 y.o. female present for CPE/CMC Graves' disease Following with Dr. Morrison Old.  Reviewed labs from 04/2021.  She has an appt in 2 mos.  Has been off meds since January.   Urticaria, chronic/Anxiety state Stable.  Under a great deal a stress right now, but coping.  Continue lexapro 20 mg qd  Ok to f/u yearly-sooner if  needed.   Vitamin D deficiency - VITAMIN D 25 Hydroxy (Vit-D Deficiency, Fractures) Chronic migraine without aura without status migrainosus, not intractable Stable.  Continue replax prn - Comprehensive metabolic panel Diabetes mellitus screening - Hemoglobin A1c Lipid screening - Lipid panel Screening for deficiency anemia - CBC Routine general medical examination at a health care facility Colonoscopy: No fx, screen at 43>Dr. Bray> 10 yr per pt.  Mammogram: completed: 07/31/2021. [facility] Cervical cancer screening: last pap: 05/2020 HPV completed by: Dr. Marice Potter. Rpt 3-5 yrs, Immunizations: tdap UTD 12/2014, Influenza UTD 07/2021(encouraged yearly), covid series completed Infectious disease screening: HIV completed Hep c completed DEXA: DEXA 07/24/2020 normal. Patient was encouraged to exercise greater than 150 minutes a week. Patient was encouraged to choose a diet filled with fresh fruits and vegetables, and lean meats. AVS provided to patient today for education/recommendation on gender specific health and safety maintenance. Return in about 1 year (around 08/26/2022) for CMC (30 min), CPE (30 min).   Orders Placed This Encounter  Procedures   CBC   Comprehensive metabolic panel   Hemoglobin A1c   Lipid panel   VITAMIN D 25 Hydroxy (Vit-D Deficiency, Fractures)    Meds ordered this encounter  Medications   eletriptan (RELPAX) 20 MG tablet    Sig: TAKE ONE TABLET BY MOUTH AT ONSET OF HEADACHE AND MAY REPEAT IN 2 HOURS IF NEEDED    Dispense:  6 tablet    Refill:  11   escitalopram (LEXAPRO) 20 MG tablet    Sig: Take 1 tablet (20 mg total) by mouth daily.    Dispense:  90 tablet    Refill:  3    Referral Orders  No referral(s) requested today     Electronically signed by: Felix Pacini, DO [facility]

## 2021-08-26 ENCOUNTER — Telehealth: Payer: Self-pay | Admitting: Family Medicine

## 2021-08-26 NOTE — Telephone Encounter (Signed)
Spoke with pt regarding labs and instructions. Pt reports she had a recent cold last week. Pt sched at 8 Friday

## 2021-08-26 NOTE — Telephone Encounter (Signed)
Please inform patient the following kidney function is normal Blood cell counts and electrolytes are normal Diabetes screening/A1c is normal  Cholesterol panel looks great and is at goal for her.  Her liver function tests are extremely high.  She did not report any recent illness or current symptoms other than she felt she had lost a couple pounds again.  Liver enzymes can be elevated for many reasons including current or recent infections, especially viral, autoimmune diseases, gallbladder disease or liver disease.  If she has started any over-the-counter supplements, I would encourage her to discontinue. Avoid any Tylenol or alcohol use for now. Please schedule her to be seen by this provider either Thursday at 1130 or Friday 8 AM slot so that we can obtain further information, exam and labs to investigate cause. Thanks

## 2021-08-29 ENCOUNTER — Encounter: Payer: Self-pay | Admitting: Family Medicine

## 2021-08-29 ENCOUNTER — Other Ambulatory Visit: Payer: Self-pay

## 2021-08-29 ENCOUNTER — Ambulatory Visit: Payer: 59 | Admitting: Family Medicine

## 2021-08-29 ENCOUNTER — Telehealth: Payer: Self-pay | Admitting: Family Medicine

## 2021-08-29 VITALS — BP 90/55 | HR 78 | Temp 98.0°F | Ht 61.0 in | Wt 113.0 lb

## 2021-08-29 DIAGNOSIS — R748 Abnormal levels of other serum enzymes: Secondary | ICD-10-CM | POA: Diagnosis not present

## 2021-08-29 DIAGNOSIS — R194 Change in bowel habit: Secondary | ICD-10-CM | POA: Diagnosis not present

## 2021-08-29 DIAGNOSIS — R7989 Other specified abnormal findings of blood chemistry: Secondary | ICD-10-CM

## 2021-08-29 DIAGNOSIS — R630 Anorexia: Secondary | ICD-10-CM | POA: Diagnosis not present

## 2021-08-29 LAB — COMPREHENSIVE METABOLIC PANEL
ALT: 485 U/L — ABNORMAL HIGH (ref 0–35)
AST: 339 U/L — ABNORMAL HIGH (ref 0–37)
Albumin: 4.2 g/dL (ref 3.5–5.2)
Alkaline Phosphatase: 57 U/L (ref 39–117)
BUN: 12 mg/dL (ref 6–23)
CO2: 31 mEq/L (ref 19–32)
Calcium: 9.1 mg/dL (ref 8.4–10.5)
Chloride: 102 mEq/L (ref 96–112)
Creatinine, Ser: 0.81 mg/dL (ref 0.40–1.20)
GFR: 86.7 mL/min (ref >60.00)
Glucose, Bld: 96 mg/dL (ref 70–99)
Potassium: 3.8 mEq/L (ref 3.5–5.1)
Sodium: 138 mEq/L (ref 135–145)
Total Bilirubin: 0.7 mg/dL (ref 0.2–1.2)
Total Protein: 6.7 g/dL (ref 6.0–8.3)

## 2021-08-29 LAB — LIPASE: Lipase: 41 U/L (ref 11.0–59.0)

## 2021-08-29 LAB — T3, FREE: T3, Free: 3 pg/mL (ref 2.3–4.2)

## 2021-08-29 LAB — T4, FREE: Free T4: 1.17 ng/dL (ref 0.60–1.60)

## 2021-08-29 LAB — C-REACTIVE PROTEIN: CRP: <1 mg/dL (ref 0.5–20.0)

## 2021-08-29 LAB — TSH: TSH: 0.82 u[IU]/mL (ref 0.35–5.50)

## 2021-08-29 NOTE — Patient Instructions (Addendum)
Great to see you today.  I have refilled the medication(s) we provide.   If labs were collected, we will inform you of lab results once received either by echart message or telephone call.   - echart message- for normal results that have been seen by the patient already.   - telephone call: abnormal results or if patient has not viewed results in their echart.  

## 2021-08-29 NOTE — Telephone Encounter (Signed)
Please inform patient Her liver enzymes are continuing to rise.  They are now 485 for ALT in 339 for her AST. Thyroid panel is normal Pancreatic enzyme is normal Inflammatory markers normal  The remainder of the tests are still pending.   I have placed a urgent referral back to her gastroenterology team to further evaluate.  I have also placed an order for abdominal ultrasound to further evaluate at [facility] to be completed ASAP.  They will call her to get this scheduled as well.  If she experiences any right upper quadrant abdominal pain, nausea, vomit or fever then she should be seen urgently over the weekend for further evaluation.

## 2021-08-29 NOTE — Telephone Encounter (Signed)
Spoke with pt regarding labs and instructions.   

## 2021-08-29 NOTE — Addendum Note (Signed)
Addended by: Maxie Barb on: 08/29/2021 04:46 PM   Modules accepted: Orders

## 2021-08-29 NOTE — Progress Notes (Signed)
   This visit occurred during the SARS-CoV-2 public health emergency.  Safety protocols were in place, including screening questions prior to the visit, additional usage of staff PPE, and extensive cleaning of exam room while observing appropriate contact time as indicated for disinfecting solutions.    Amy Delacruz , 07/25/1976, 45 y.o., female MRN: [id number] Patient Care Team    Relationship Specialty Notifications Start End  Kuneff, Renee A, DO PCP - General Family Medicine  10/06/16   Lambeth, John, MD    10/07/16    Comment: endocrine  Bray, William C, MD Referring Physician Gastroenterology  08/25/21   Constant, Peggy, MD Consulting Physician Obstetrics and Gynecology  08/25/21   Huang, William Wei-Ting, MD Referring Physician Dermatology  08/25/21   Thekkekandam, Thomas J, MD Consulting Physician Sports Medicine  08/25/21     Chief Complaint  Patient presents with   Elevated Hepatic Enzymes     Subjective: Pt presents for an OV to follow-up on incidental finding of significantly elevated liver enzymes on her physical last week.  Patient was found to have an ALT of 349 and AST of 208.  She is never had elevated liver enzymes in the past.  She does endorse increased fatigue, unintentional weight loss of 4 pounds, decreased appetite-which she attributed to the stress in her life.  She endorses being sick with a cold/virus the week prior to her labs.  She is not certain if flu, COVID or origin etc.  She states she had a mild cough and nasal congestion but felt she got over the illness rather quickly.  She denies fever, chills, nausea, vomit or right upper quadrant pain.  She denies any melena or hematochezia.  She states she has noticed a mild change in her bowel movements.  She states she will have a small bowel movement 3-4 times a day that is soft, but not diarrhea.  She reports she feels like she has to have a BM frequently, but will only produce small amounts multiple times a day.   She had her colonoscopy 05/29/2021 and it was normal per patient. Depression screen PHQ 2/9 08/25/2021 08/20/2020 07/21/2019 06/24/2018 06/23/2017  Decreased Interest 2 0 0 0 -  Down, Depressed, Hopeless 1 0 0 0 1  PHQ - 2 Score 3 0 0 0 1  Altered sleeping 3 - - 1 0  Tired, decreased energy 2 - - 1 1  Change in appetite 3 - - 0 0  Feeling bad or failure about yourself  0 - - 0 0  Trouble concentrating 1 - - 0 0  Moving slowly or fidgety/restless 0 - - 0 1  Suicidal thoughts 0 - - 0 0  PHQ-9 Score 12 - - 2 3  Difficult doing work/chores - - - Somewhat difficult -    Allergies  Allergen Reactions   Other Hives    Mandarin oranges   Social History   Social History Narrative   Married to Tom. 2 children Kimberly and Katie.    SAHM. Post grad education.    Nonsmoker.    Drinks caffeine, takes a daily vitamin   Wears seatbelt, bicycle helmet, exercises routinely.    Smoke detector in the home.    Past Medical History:  Diagnosis Date   Abnormal Pap smear of cervix 2007   Allergy    Depression with anxiety 2007   GERD (gastroesophageal reflux disease)    Graves disease    Graves' ophthalmopathy    follows with ophthalmology     Hearing difficulty of both ears 01/30/2020   History of positive PPD    cxr negative   Lupus erythematosus    managed by dermatology    Migraine    Nonspecific reaction to 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 Overview:  neg CXR s/p tx   Perioral dermatitis 12/13/2017   Rosacea    Scoliosis    Seasonal allergies    Urticaria 08/2016   Varicella    Vitamin D deficiency    Past Surgical History:  Procedure Laterality Date   DILATION AND CURETTAGE OF UTERUS  2004   2004,2007   SKIN LESION EXCISION     TONSILLECTOMY  1978   WISDOM TOOTH EXTRACTION  2000   Family History  Problem Relation Age of Onset   Breast cancer Paternal Grandmother 50   Hypertension  Mother    Lung disease Mother    Arthritis Mother    Hypertension Father    Bleeding Disorder Father    Stroke Maternal Grandmother    Hypertension Maternal Grandmother    Depression Sister    Depression Maternal Aunt    Allergies as of 08/29/2021       Reactions   Other Hives   Mandarin oranges        Medication List        Accurate as of August 29, 2021 12:48 PM. If you have any questions, ask your nurse or doctor.          eletriptan 20 MG tablet Commonly known as: RELPAX TAKE ONE TABLET BY MOUTH AT ONSET OF HEADACHE AND MAY REPEAT IN 2 HOURS IF NEEDED   escitalopram 20 MG tablet Commonly known as: LEXAPRO Take 1 tablet (20 mg total) by mouth daily.   METRONIDAZOLE (TOPICAL) 0.75 % Lotn APPLY TOPICALLY TWICE DAILY AS DIRECTED   multivitamin tablet Take by mouth.        All past medical history, surgical history, allergies, family history, immunizations andmedications were updated in the EMR today and reviewed under the history and medication portions of their EMR.     ROS: Negative, with the exception of above mentioned in HPI   Objective:  BP (!) 90/55   Pulse 78   Temp 98 F (36.7 C) (Oral)   Ht 5' 1" (1.549 m)   Wt 113 lb (51.3 kg)   SpO2 95%   BMI 21.35 kg/m  Body mass index is 21.35 kg/m. Gen: Afebrile. No acute distress. Nontoxic in appearance, well developed, well nourished.  HENT: AT. [place].  Eyes:Pupils Equal Round Reactive to light, Extraocular movements intact,  Conjunctiva without redness, discharge or icterus. Neck/lymp/endocrine: Supple,no lymphadenopathy, no thyromegaly.  CV: RRR  Abd: Soft. flat. NTND. BS present. no Masses palpated. No rebound or guarding.  Skin: no rashes, purpura or petechiae.  Neuro: Normal gait. PERLA. EOMi. Alert. Oriented x3  Psych: Normal affect, dress and demeanor. Normal speech. Normal thought content and judgment.  No results found. No results found. No results found for this or any previous visit  (from the past 24 hour(s)).  Assessment/Plan: Amy Delacruz is a 45 y.o. female present for OV for  Elevated liver enzymes/Bowel habit changes/Decreased appetite Uncertain cause of elevation, possibly 2/2 to recent illness of unknown origin. It sounds as if she may have had a viral illness the week prior to labs. She also has seen some bowel habit changes and wt loss which are more concerning. ?   IBD vs infectious - Hepatitis, Acute - TSH - T4, free - T3, free - Lactate dehydrogenase - Iron, TIBC and Ferritin Panel - Lipase - Comp Met (CMET) - C-reactive protein F/u dependent upon lab results. We discussed abd [place] if labs indicate need. That order will be placed for if needed.    Reviewed expectations re: course of current medical issues. Discussed self-management of symptoms. Outlined signs and symptoms indicating need for more acute intervention. Patient verbalized understanding and all questions were answered. Patient received an After-Visit Summary.    Orders Placed This Encounter  Procedures   Hepatitis, Acute   TSH   T4, free   T3, free   Lactate dehydrogenase   Iron, TIBC and Ferritin Panel   Lipase   Comp Met (CMET)   C-reactive protein   No orders of the defined types were placed in this encounter.  Referral Orders  No referral(s) requested today     Note is dictated utilizing voice recognition software. Although note has been proof read prior to signing, occasional typographical errors still can be missed. If any questions arise, please do not hesitate to call for verification.   electronically signed by:  Howard Pouch, DO  [facility]

## 2021-08-30 LAB — LACTATE DEHYDROGENASE: LDH: 275 U/L — ABNORMAL HIGH (ref 100–200)

## 2021-09-01 ENCOUNTER — Telehealth: Payer: Self-pay | Admitting: Family Medicine

## 2021-09-01 ENCOUNTER — Other Ambulatory Visit: Payer: Self-pay

## 2021-09-01 ENCOUNTER — Ambulatory Visit (INDEPENDENT_AMBULATORY_CARE_PROVIDER_SITE_OTHER): Payer: 59

## 2021-09-01 DIAGNOSIS — R7989 Other specified abnormal findings of blood chemistry: Secondary | ICD-10-CM

## 2021-09-01 DIAGNOSIS — R7401 Elevation of levels of liver transaminase levels: Secondary | ICD-10-CM | POA: Diagnosis not present

## 2021-09-01 LAB — IRON,TIBC AND FERRITIN PANEL
%SAT: 41 % (calc) (ref 16–45)
Ferritin: 187 ng/mL (ref 16–232)
Iron: 110 ug/dL (ref 40–190)
TIBC: 271 mcg/dL (calc) (ref 250–450)

## 2021-09-01 LAB — HEPATITIS PANEL, ACUTE
Hep A IgM: NONREACTIVE
Hep B C IgM: NONREACTIVE
Hepatitis B Surface Ag: NONREACTIVE
Hepatitis C Ab: NONREACTIVE
SIGNAL TO CUT-OFF: 0.08 (ref <1.00)

## 2021-09-01 NOTE — Telephone Encounter (Signed)
Please inform patient the following information: Iron panel is in normal range.  Inflammatory marker is normal.  Thyroid is normal.  Lipase-pancreatic enzyme is normal. LDH is mildly elevated-this can be a sign of early hepatitis Hepatitis panel is still pending.  Ultrasound not have any specific findings to suggest cause of her elevated liver enzymes.  However it did show the common bile duct diameter within the upper limits of normal.  This could be suggestive of gallbladder stones or other causes of blockage of the common bile duct.  Had placed a referral to her GI team urgently.  I would also encourage her to call her GI team so that she can get in quicker.  Please send all results to Dr. Jason Fila.  Incidentally found was a possible small right kidney stone and a small cyst of the liver.  Neither which are the cause of her elevated liver enzymes.   Staff: Please make sure her GI team understands this is an urgent referral.

## 2021-09-02 NOTE — Telephone Encounter (Signed)
Spoke with pt regarding labs and instructions.   

## 2021-09-08 ENCOUNTER — Other Ambulatory Visit: Payer: Self-pay | Admitting: Physician Assistant

## 2021-09-08 DIAGNOSIS — R7989 Other specified abnormal findings of blood chemistry: Secondary | ICD-10-CM

## 2021-09-15 ENCOUNTER — Ambulatory Visit (INDEPENDENT_AMBULATORY_CARE_PROVIDER_SITE_OTHER): Payer: 59

## 2021-09-15 ENCOUNTER — Other Ambulatory Visit: Payer: Self-pay

## 2021-09-15 DIAGNOSIS — R7401 Elevation of levels of liver transaminase levels: Secondary | ICD-10-CM

## 2021-09-15 DIAGNOSIS — R7989 Other specified abnormal findings of blood chemistry: Secondary | ICD-10-CM | POA: Diagnosis not present

## 2021-09-15 MED ORDER — GADOBUTROL 1 MMOL/ML IV SOLN
5.0000 mL | Freq: Once | INTRAVENOUS | Status: AC | PRN
  Administered 2021-09-15: 5 mL via INTRAVENOUS

## 2021-09-17 ENCOUNTER — Encounter: Payer: Self-pay | Admitting: Family Medicine

## 2021-09-17 NOTE — Telephone Encounter (Signed)
Please advise 

## 2021-09-22 NOTE — Telephone Encounter (Signed)
Patient still does not have an understanding of what the charges for.  The charges not just for the form that she completed.  The charges are for covering chronic conditions during her preventative exam.  Preventative exam only covers preventative measures such as immunizations, mammograms etc.  Patient has 2 medications that were refilled during her preventative exam for her chronic conditions.  That is what the charges are for, not the form that she filled out concerning her anxiety.  Patient did want refills on her medicines.

## 2021-09-22 NOTE — Telephone Encounter (Signed)
Patient was charged with the lowest possible visit charge with her physical due to discussion/refills on her anxiety medication (Lexapro) and her migraine medication.  Those are not considered part of a preventative health maintenance visit.   -In the future if she would like her preventative visits to to cover only preventative, then she would need to return every 6 months to cover her chronic conditions.   -Completing the preventative and her chronic conditions allows her to combine 2 appointments into 1, thus allowing her to be seen only 2 times a year instead of 3.  Again, if this is not what she desires, we can do preventative only-but we would not be able to do her refills.  I do apologize, I know this is confusing to patients, it can be confusing to all involved.  Unfortunately, the insurance company is the one dictating how we have to bill for our services provided. All we can do is morally and ethically bill for services we provide, the insurance companies dictate how they will pay.

## 2022-02-23 ENCOUNTER — Telehealth: Payer: Self-pay

## 2022-02-23 DIAGNOSIS — R7989 Other specified abnormal findings of blood chemistry: Secondary | ICD-10-CM

## 2022-02-23 NOTE — Telephone Encounter (Signed)
Form received and placed on PCP desk for review

## 2022-02-24 ENCOUNTER — Other Ambulatory Visit: Payer: Self-pay | Admitting: Family Medicine

## 2022-02-24 DIAGNOSIS — R7989 Other specified abnormal findings of blood chemistry: Secondary | ICD-10-CM | POA: Insufficient documentation

## 2022-02-24 NOTE — Telephone Encounter (Signed)
Completed and returned to cma work basket 

## 2022-02-24 NOTE — Telephone Encounter (Signed)
Faxed. pt informed

## 2022-03-10 ENCOUNTER — Ambulatory Visit: Payer: 59 | Admitting: Family Medicine

## 2022-03-10 ENCOUNTER — Encounter: Payer: Self-pay | Admitting: Family Medicine

## 2022-03-10 VITALS — BP 102/72 | HR 71 | Temp 98.0°F | Wt 115.0 lb

## 2022-03-10 DIAGNOSIS — R7989 Other specified abnormal findings of blood chemistry: Secondary | ICD-10-CM

## 2022-03-10 DIAGNOSIS — E0500 Thyrotoxicosis with diffuse goiter without thyrotoxic crisis or storm: Secondary | ICD-10-CM | POA: Diagnosis not present

## 2022-03-10 DIAGNOSIS — R5383 Other fatigue: Secondary | ICD-10-CM | POA: Diagnosis not present

## 2022-03-10 DIAGNOSIS — E559 Vitamin D deficiency, unspecified: Secondary | ICD-10-CM

## 2022-03-10 LAB — COMPREHENSIVE METABOLIC PANEL
ALT: 56 U/L — ABNORMAL HIGH (ref 0–35)
AST: 47 U/L — ABNORMAL HIGH (ref 0–37)
Albumin: 4.3 g/dL (ref 3.5–5.2)
Alkaline Phosphatase: 48 U/L (ref 39–117)
BUN: 12 mg/dL (ref 6–23)
CO2: 28 mEq/L (ref 19–32)
Calcium: 9.2 mg/dL (ref 8.4–10.5)
Chloride: 103 mEq/L (ref 96–112)
Creatinine, Ser: 0.74 mg/dL (ref 0.40–1.20)
GFR: 96.28 mL/min (ref >60.00)
Glucose, Bld: 85 mg/dL (ref 70–99)
Potassium: 4.2 mEq/L (ref 3.5–5.1)
Sodium: 138 mEq/L (ref 135–145)
Total Bilirubin: 0.6 mg/dL (ref 0.2–1.2)
Total Protein: 6.8 g/dL (ref 6.0–8.3)

## 2022-03-10 LAB — T3, FREE: T3, Free: 2.9 pg/mL (ref 2.3–4.2)

## 2022-03-10 LAB — IBC + FERRITIN
Ferritin: 39.6 ng/mL (ref 10.0–291.0)
Iron: 70 ug/dL (ref 42–145)
Saturation Ratios: 24.5 % (ref 20.0–50.0)
TIBC: 285.6 ug/dL (ref 250.0–450.0)
Transferrin: 204 mg/dL — ABNORMAL LOW (ref 212.0–360.0)

## 2022-03-10 LAB — B12 AND FOLATE PANEL
Folate: 19.5 ng/mL (ref >5.9)
Vitamin B-12: 472 pg/mL (ref 211–911)

## 2022-03-10 LAB — TSH: TSH: 0.98 u[IU]/mL (ref 0.35–5.50)

## 2022-03-10 LAB — VITAMIN D 25 HYDROXY (VIT D DEFICIENCY, FRACTURES): VITD: 34.29 ng/mL (ref 30.00–100.00)

## 2022-03-10 LAB — T4, FREE: Free T4: 0.85 ng/dL (ref 0.60–1.60)

## 2022-03-10 NOTE — Patient Instructions (Signed)
  Great to see you today.    If labs were collected, we will inform you of lab results once received either by echart message or telephone call.   - echart message- for normal results that have been seen by the patient already.   - telephone call: abnormal results or if patient has not viewed results in their echart.  

## 2022-03-10 NOTE — Progress Notes (Signed)
? ? ? ? ? ?Amy Delacruz , 06/22/1976, 46 y.o., female ?MRN: [id number] ?Patient Care Team  ?  Relationship Specialty Notifications Start End  ?Ma Hillock, DO PCP - General Family Medicine  10/06/16   ?Susette Racer, MD    10/07/16   ? Comment: endocrine  ?Rosana Berger, MD Referring Physician Gastroenterology  08/25/21   ?Mora Bellman, MD Consulting Physician Obstetrics and Gynecology  08/25/21   ?Garry Heater, MD Referring Physician Dermatology  08/25/21   ?Silverio Decamp, MD Consulting Physician Sports Medicine  08/25/21   ? ? ?Chief Complaint  ?Patient presents with  ? Elevated Hepatic Enzymes  ?  Stopped taking vitamins due to elevated Lfts. Not feeling well  ? Fatigue  ?  Concerned she may be having tsh issues again  ? ?  ?Subjective: Pt presents for an OV with complaints of increased fatigue. She has been under care of GI for elevated LFT that are returning to normal. Bx was inconclusive (inflammatory changes). She stopped her generic [employer] MV and lft are returning to normal. ?However, she is starting to feel more fatigued and achy now. She is uncertain if this is from low vitamins since she stopped all vitamins or possible her thyroid. She has a h/o hyperthyroid/Graves disease and has required tapazole in the past. She has notcied increase in pulse at times, especially at night.  ? ?  08/25/2021  ?  9:10 AM 08/20/2020  ?  9:38 AM 07/21/2019  ?  8:06 AM 06/24/2018  ?  2:02 PM 06/23/2017  ? 10:55 AM  ?Depression screen PHQ 2/9  ?Decreased Interest 2 0 0 0   ?Down, Depressed, Hopeless 1 0 0 0 1  ?PHQ - 2 Score 3 0 0 0 1  ?Altered sleeping 3   1 0  ?Tired, decreased energy _0 ?Change in appetite 3   0 0  ?Feeling bad or failure about yourself  0   0 0  ?Trouble concentrating 1   0 0  ?Moving slowly or fidgety/restless 0   0 1  ?Suicidal thoughts 0   0 0  ?PHQ-9 Score _1 ?Difficult doing work/chores    Somewhat difficult   ? ? ?Allergies  ?Allergen Reactions  ? Other Hives   ?  Mandarin oranges  ? ?Social History  ? ?Social History Narrative  ? Married to [place]. 2 children Joelene [place] and [place].   ? SAHM. Post grad education.   ? Nonsmoker.   ? Drinks caffeine, takes a daily vitamin  ? Wears seatbelt, bicycle helmet, exercises routinely.   ? Smoke detector in the home.   ? ?Past Medical History:  ?Diagnosis Date  ? Abnormal Pap smear of cervix 2007  ? Allergy   ? Depression with anxiety 2007  ? GERD (gastroesophageal reflux disease)   ? Graves disease   ? Graves' ophthalmopathy   ? follows with ophthalmology  ? Hearing difficulty of both ears 01/30/2020  ? History of positive PPD   ? cxr negative  ? Lumbar spondylosis with scoliosis   ? Lupus erythematosus   ? managed by dermatology   ? Migraine   ? Migraine, chronic, without aura 07/21/2019  ? Nonspecific reaction to tuberculin skin test without active tuberculosis 11/06/2014  ? Formatting of this note might be different from the original. Overview:  neg CXR s/p tx  ? Perioral dermatitis 12/13/2017  ? Rosacea   ? Scoliosis   ?  Seasonal allergies   ? Urticaria 08/2016  ? Urticaria, chronic 10/06/2016  ? Varicella   ? Vitamin D deficiency   ? ?Past Surgical History:  ?Procedure Laterality Date  ? DILATION AND CURETTAGE OF UTERUS  2004  ? 3244,0102  ? SKIN LESION EXCISION    ? TONSILLECTOMY  1978  ? [place] EXTRACTION  2000  ? ?Family History  ?Problem Relation Age of Onset  ? Breast cancer Paternal Grandmother 70  ? Hypertension Mother   ? Lung disease Mother   ? Arthritis Mother   ? Hypertension Father   ? Bleeding Disorder Father   ? Stroke Maternal Grandmother   ? Hypertension Maternal Grandmother   ? Depression Sister   ? Depression Maternal Aunt   ? ?Allergies as of 03/10/2022   ? ?   Reactions  ? Other Hives  ? Mandarin oranges  ? ?  ? ?  ?Medication List  ?  ? ?  ? Accurate as of Mar 10, 2022 10:58 AM. If you have any questions, ask your nurse or doctor.  ?  ?  ? ?  ? ?STOP taking these medications   ? ?multivitamin tablet ?Stopped  by: Howard Pouch, DO ?  ?Rhofade 1 % Crea ?Generic drug: Oxymetazoline HCl ?Stopped by: Howard Pouch, DO ?  ? ?  ? ?TAKE these medications   ? ?eletriptan 20 MG tablet ?Commonly known as: RELPAX ?TAKE ONE TABLET BY MOUTH AT ONSET OF HEADACHE AND MAY REPEAT IN 2 HOURS IF NEEDED ?  ?escitalopram 20 MG tablet ?Commonly known as: LEXAPRO ?Take 1 tablet (20 mg total) by mouth daily. ?What changed: how much to take ?  ?METRONIDAZOLE (TOPICAL) 0.75 % Lotn ?APPLY TOPICALLY TWICE DAILY AS DIRECTED ?  ? ?  ? ? ?All past medical history, surgical history, allergies, family history, immunizations andmedications were updated in the EMR today and reviewed under the history and medication portions of their EMR.    ? ?ROS ?Negative, with the exception of above mentioned in HPI ? ? ?Objective:  ?BP 102/72   Pulse 71   Temp 98 ?F (36.7 ?C)   Wt 115 lb (52.2 kg)   SpO2 98%   BMI 21.73 kg/m?  ?Body mass index is 21.73 kg/m?Marland Kitchen ?Physical Exam ?Vitals and nursing note reviewed.  ?Constitutional:   ?   General: She is not in acute distress. ?   Appearance: Normal appearance. She is normal weight. She is not ill-appearing or toxic-appearing.  ?Eyes:  ?   Extraocular Movements: Extraocular movements intact.  ?   Conjunctiva/sclera: Conjunctivae normal.  ?   Pupils: Pupils are equal, round, and reactive to light.  ?Neck:  ?   Comments: No thyromegaly.  ?Cardiovascular:  ?   Rate and Rhythm: Normal rate and regular rhythm.  ?   Heart sounds: No murmur heard. ?Pulmonary:  ?   Effort: Pulmonary effort is normal. No respiratory distress.  ?   Breath sounds: No wheezing, rhonchi or rales.  ?Skin: ?   Findings: No rash.  ?Neurological:  ?   Mental Status: She is alert and oriented to person, place, and time. Mental status is at baseline.  ?Psychiatric:     ?   Mood and Affect: Mood normal.     ?   Behavior: Behavior normal.     ?   Thought Content: Thought content normal.     ?   Judgment: Judgment normal.  ? ? ? ?No results found. ?No results  found. ?No  results found for this or any previous visit (from the past 24 hour(s)). ? ?Assessment/Plan: ?Amayra D Plitt is a 46 y.o. female present for OV for  ?Vitamin D deficiency ?- VITAMIN D 25 Hydroxy (Vit-D Deficiency, Fractures) ?- will guide on supplement need and dose after results.  ?Elevated LFTs ?Has been taking MV with iron ([employer] brand)- stopped d/t elevated LFT since it was the only known change for her (had been on [employer] brand prior) ?- IBC + Ferritin ?- Comp Met (CMET)- reviewed last labs in EMR ALT61/AST58 ? ?Graves' disease/fatigue ?Fatigue Could be 2/2 to thyroid causes vs vit def.  ?- TSH ?- T4, free ?- T3, free ?- B12 and Folate Panel ? ?Reviewed expectations re: course of current medical issues. ?Discussed self-management of symptoms. ?Outlined signs and symptoms indicating need for more acute intervention. ?Patient verbalized understanding and all questions were answered. ?Patient received an After-Visit Summary. ? ? ? ?Orders Placed This Encounter  ?Procedures  ? VITAMIN D 25 Hydroxy (Vit-D Deficiency, Fractures)  ? IBC + Ferritin  ? Comp Met (CMET)  ? TSH  ? T4, free  ? T3, free  ? B12 and Folate Panel  ? ?No orders of the defined types were placed in this encounter. ? ?Referral Orders  ?No referral(s) requested today  ? ? ? ?Note is dictated utilizing voice recognition software. Although note has been proof read prior to signing, occasional typographical errors still can be missed. If any questions arise, please do not hesitate to call for verification.  ? ?electronically signed by: ? ?Howard Pouch, DO  ?[facility] [place] ? ? ? ?

## 2022-03-26 ENCOUNTER — Other Ambulatory Visit: Payer: Self-pay | Admitting: Gastroenterology

## 2022-03-26 DIAGNOSIS — R7989 Other specified abnormal findings of blood chemistry: Secondary | ICD-10-CM

## 2022-03-26 DIAGNOSIS — R935 Abnormal findings on diagnostic imaging of other abdominal regions, including retroperitoneum: Secondary | ICD-10-CM

## 2022-05-18 ENCOUNTER — Ambulatory Visit: Payer: 59 | Admitting: Family Medicine

## 2022-05-18 NOTE — Progress Notes (Deleted)
Amy Delacruz, Amy Delacruz 13-Feb-1976, 46 y.o., female MRN: [id number] Patient Care Team    Relationship Specialty Notifications Start End  Natalia Leatherwood, DO PCP - General Family Medicine  10/06/16   Warden Fillers, MD    10/07/16    Comment: endocrine  Konrad Penta, MD Referring Physician Gastroenterology  08/25/21   Catalina Antigua, MD Consulting Physician Obstetrics and Gynecology  08/25/21   Kandice Hams, MD Referring Physician Dermatology  08/25/21   Monica Becton, MD Consulting Physician Sports Medicine  08/25/21     No chief complaint on file.    Subjective: Pt presents for an OV with complaints of *** of *** duration.  Associated symptoms include ***.  Pt has tried *** to ease their symptoms.      08/25/2021    9:10 AM 08/20/2020    9:38 AM 07/21/2019    8:06 AM 06/24/2018    2:02 PM 06/23/2017   10:55 AM  Depression screen PHQ 2/9  Decreased Interest 2 0 0 0   Down, Depressed, Hopeless 1 0 0 0 1  PHQ - 2 Score 3 0 0 0 1  Altered sleeping 3   1 0  Tired, decreased energy 2   1 1   Change in appetite 3   0 0  Feeling bad or failure about yourself  0   0 0  Trouble concentrating 1   0 0  Moving slowly or fidgety/restless 0   0 1  Suicidal thoughts 0   0 0  PHQ-9 Score 12   2 3   Difficult doing work/chores    Somewhat difficult     Allergies  Allergen Reactions   Other Hives    Mandarin oranges   Social History   Social History Narrative   Married to [place]. 2 children and [place].    SAHM. Post grad education.    Nonsmoker.    Drinks caffeine, takes a daily vitamin   Wears seatbelt, bicycle helmet, exercises routinely.    Smoke detector in the home.    Past Medical History:  Diagnosis Date   Abnormal Pap smear of cervix 2007   Allergy    Depression with anxiety 2007   GERD (gastroesophageal reflux disease)    Graves disease    Graves' ophthalmopathy    follows with ophthalmology   Hearing difficulty of both ears 01/30/2020    History of positive PPD    cxr negative   Lumbar spondylosis with scoliosis    Lupus erythematosus    managed by dermatology    Migraine    Migraine, chronic, without aura 07/21/2019   Nonspecific reaction to 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 Overview:  neg CXR s/p tx   Perioral dermatitis 12/13/2017   Rosacea    Scoliosis    Seasonal allergies    Urticaria 08/2016   Urticaria, chronic 10/06/2016   Varicella    Vitamin D deficiency    Past Surgical History:  Procedure Laterality Date   DILATION AND CURETTAGE OF UTERUS  2004   2004,2007   SKIN LESION EXCISION     TONSILLECTOMY  1978   WISDOM TOOTH EXTRACTION  2000   Family History  Problem Relation Age of Onset   Breast cancer Paternal Grandmother 38   Hypertension Mother    Lung disease Mother    Arthritis Mother    Hypertension Father  Bleeding Disorder Father    Stroke Maternal Grandmother    Hypertension Maternal Grandmother    Depression Sister    Depression Maternal Aunt    Allergies as of 05/18/2022       Reactions   Other Hives   Mandarin oranges        Medication List        Accurate as of May 18, 2022  8:00 AM. If you have any questions, ask your nurse or doctor.          eletriptan 20 MG tablet Commonly known as: RELPAX TAKE ONE TABLET BY MOUTH AT ONSET OF HEADACHE AND MAY REPEAT IN 2 HOURS IF NEEDED   escitalopram 20 MG tablet Commonly known as: LEXAPRO Take 1 tablet (20 mg total) by mouth daily. What changed: how much to take   METRONIDAZOLE (TOPICAL) 0.75 % Lotn APPLY TOPICALLY TWICE DAILY AS DIRECTED        All past medical history, surgical history, allergies, family history, immunizations andmedications were updated in the EMR today and reviewed under the history and medication portions of their EMR.     ROS Negative, with the exception of above mentioned in HPI   Objective:   There were no vitals taken for this visit. There is no height or weight on file to calculate BMI.  Physical Exam   No results found. No results found. No results found for this or any previous visit (from the past 24 hour(s)).  Assessment/Plan: Amy Delacruz is a 46 y.o. female present for OV for  *** Reviewed expectations re: course of current medical issues. Discussed self-management of symptoms. Outlined signs and symptoms indicating need for more acute intervention. Patient verbalized understanding and all questions were answered. Patient received an After-Visit Summary.    No orders of the defined types were placed in this encounter.  No orders of the defined types were placed in this encounter.  Referral Orders  No referral(s) requested today     Note is dictated utilizing voice recognition software. Although note has been proof read prior to signing, occasional typographical errors still can be missed. If any questions arise, please do not hesitate to call for verification.   electronically signed by:  Felix Pacini, DO  [facility] - [place]

## 2022-05-27 IMAGING — MG MM DIGITAL SCREENING BILAT W/ TOMO AND CAD
8 series · 9 of 24 positions shown · non-contrast
Comparison: Previous exam(s).

CLINICAL DATA: Screening.

EXAM:
DIGITAL SCREENING BILATERAL MAMMOGRAM WITH TOMOSYNTHESIS AND CAD
TECHNIQUE: Bilateral screening digital craniocaudal and mediolateral oblique
mammograms were obtained. Bilateral screening digital breast
tomosynthesis was performed. The images were evaluated with
computer-aided detection.

[L CC synth-2D]
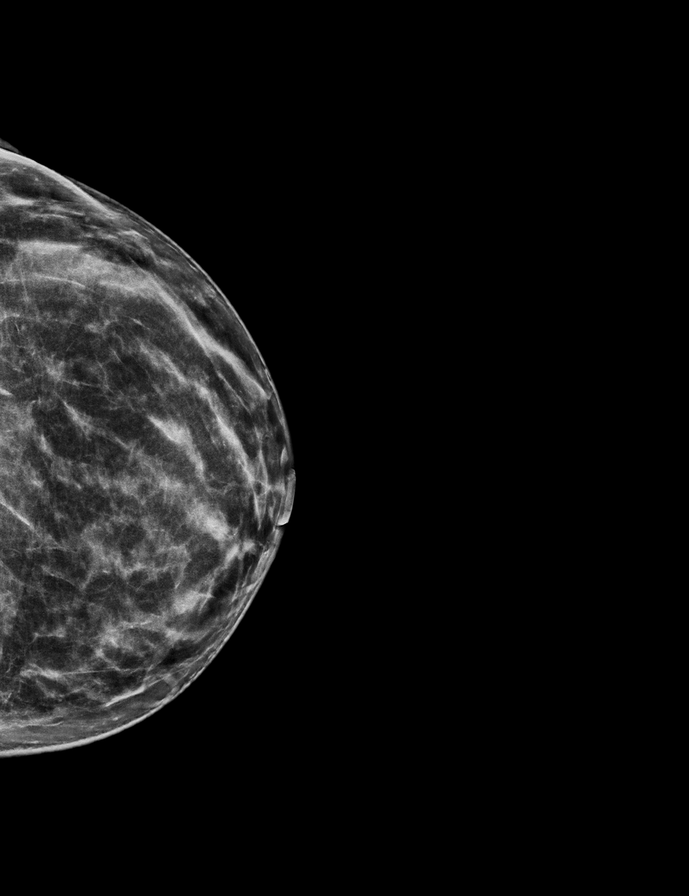

[L MLO synth-2D]
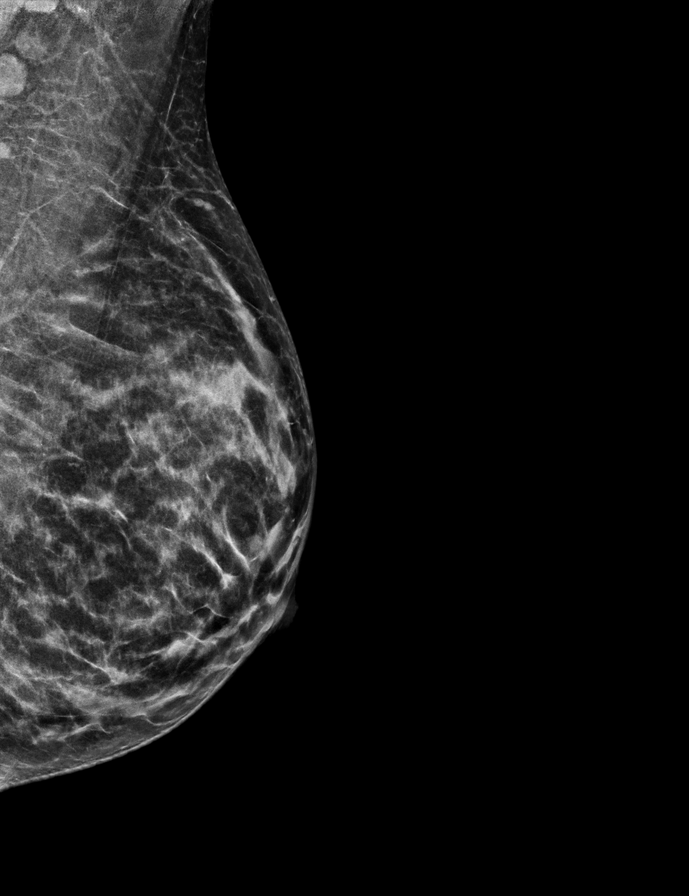

[R CC synth-2D]
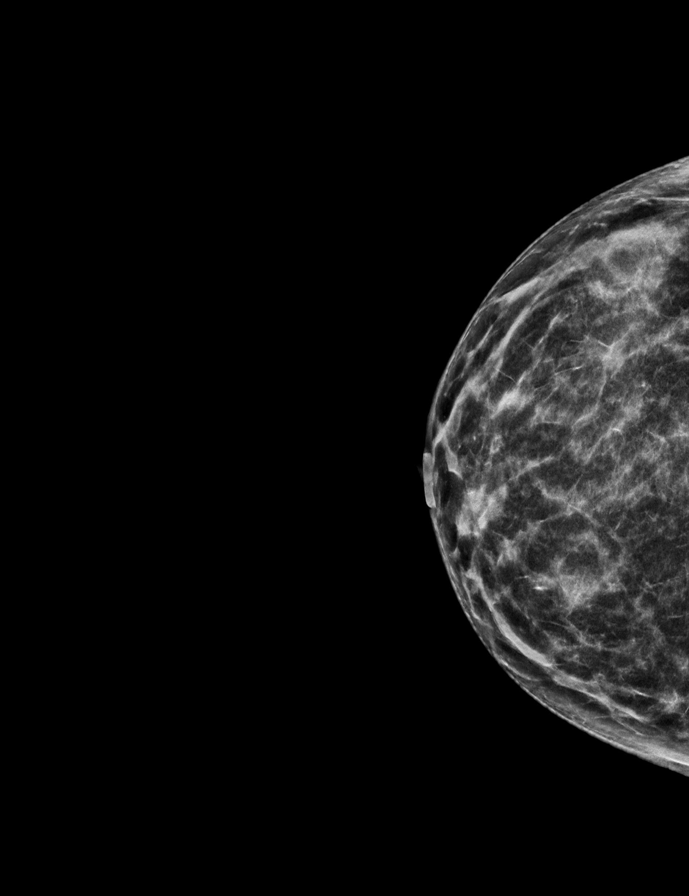

[R MLO synth-2D]
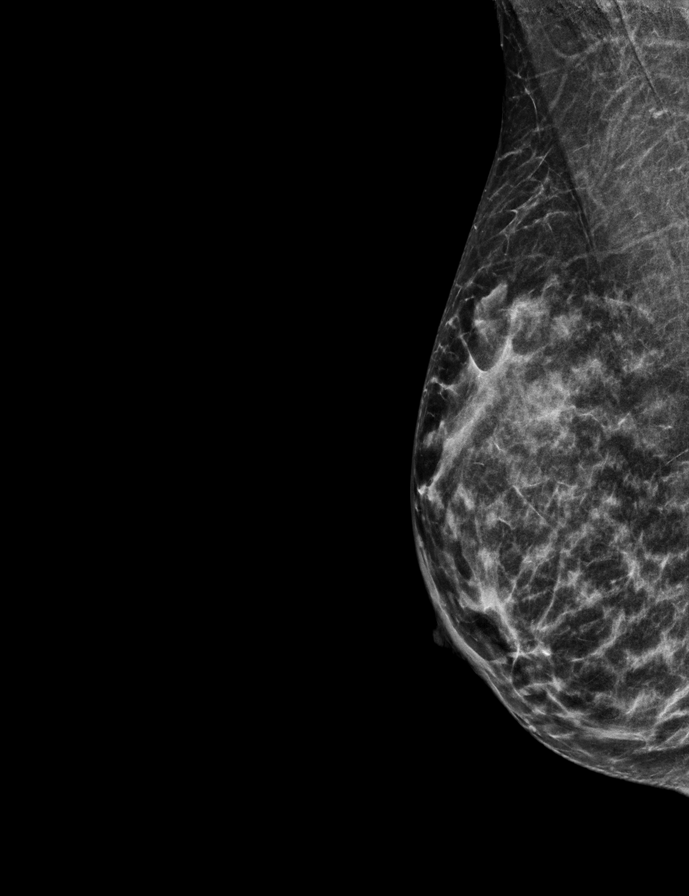

[R MLO tomo · 2 of 49 frames shown]
[frame 16/49]
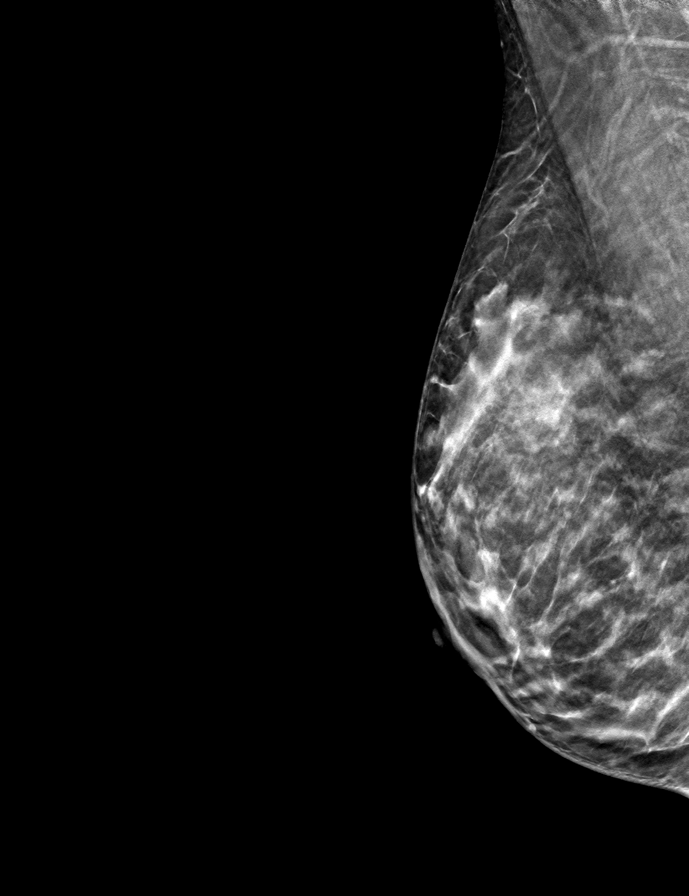
[frame 25/49]
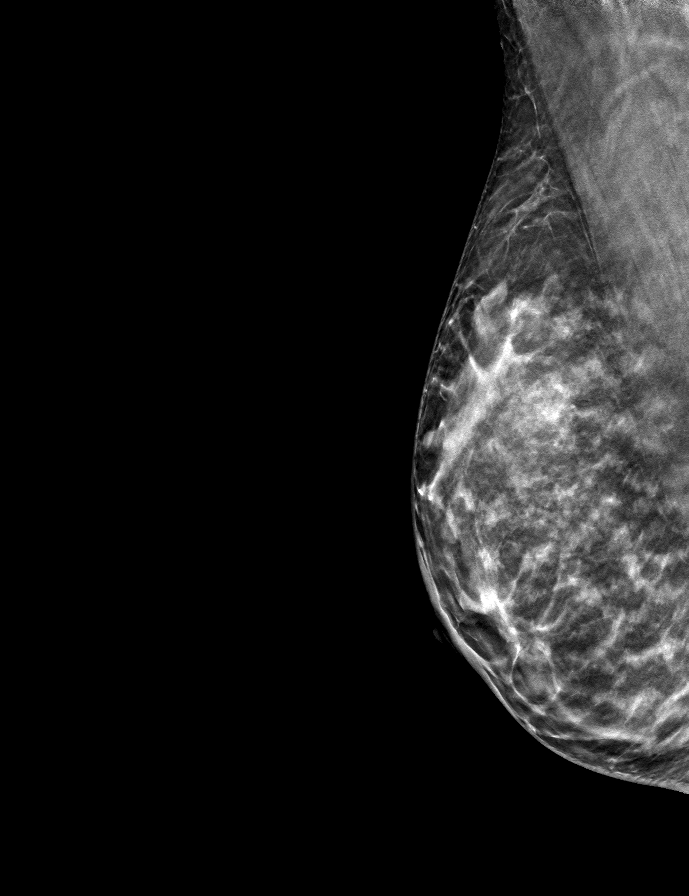

[L CC tomo · tomo slice 28/55.0]
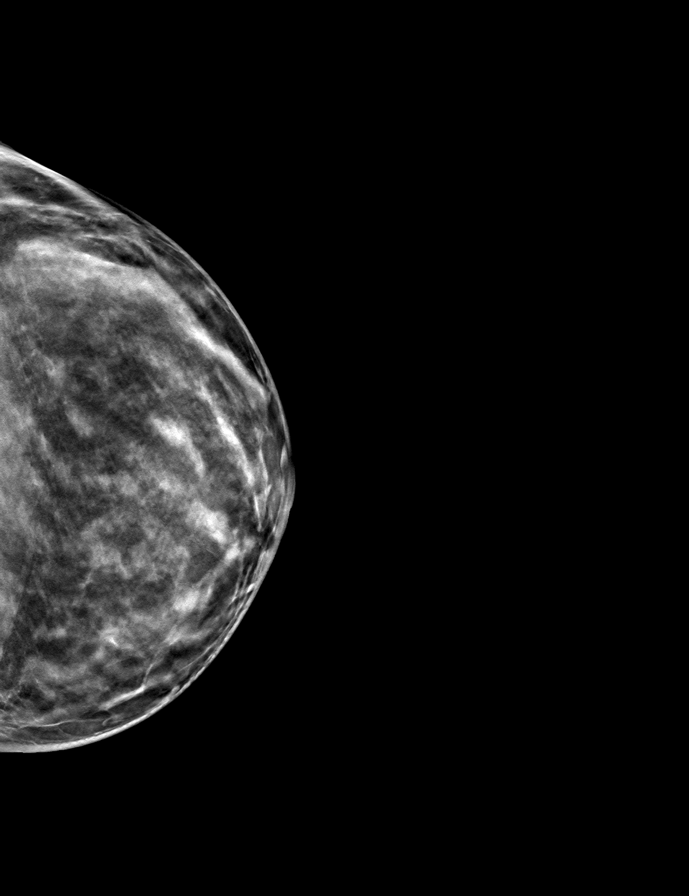

[R CC tomo · tomo slice 25/50.0]
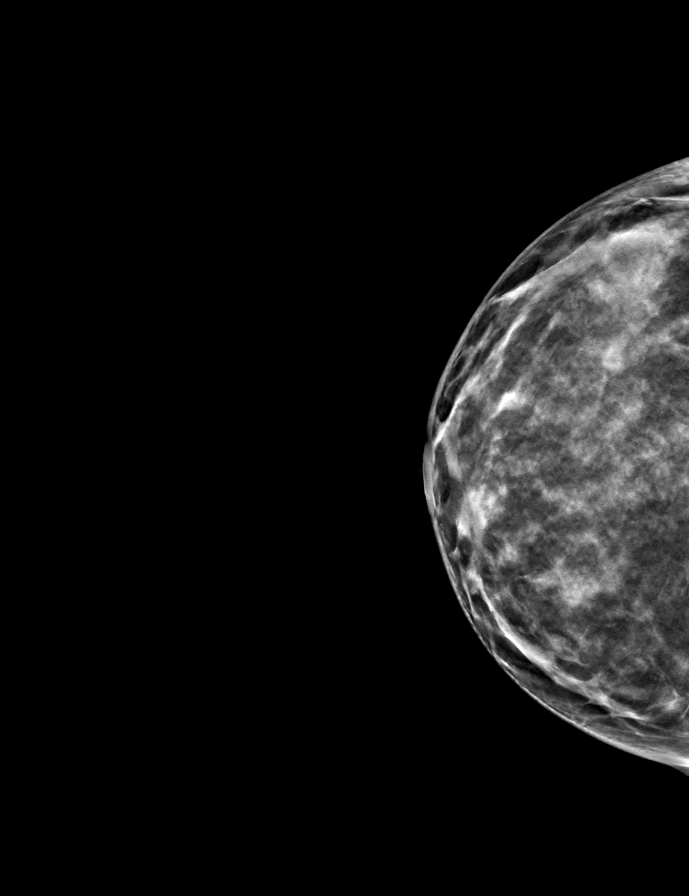

[L MLO tomo · tomo slice 25/50.0]
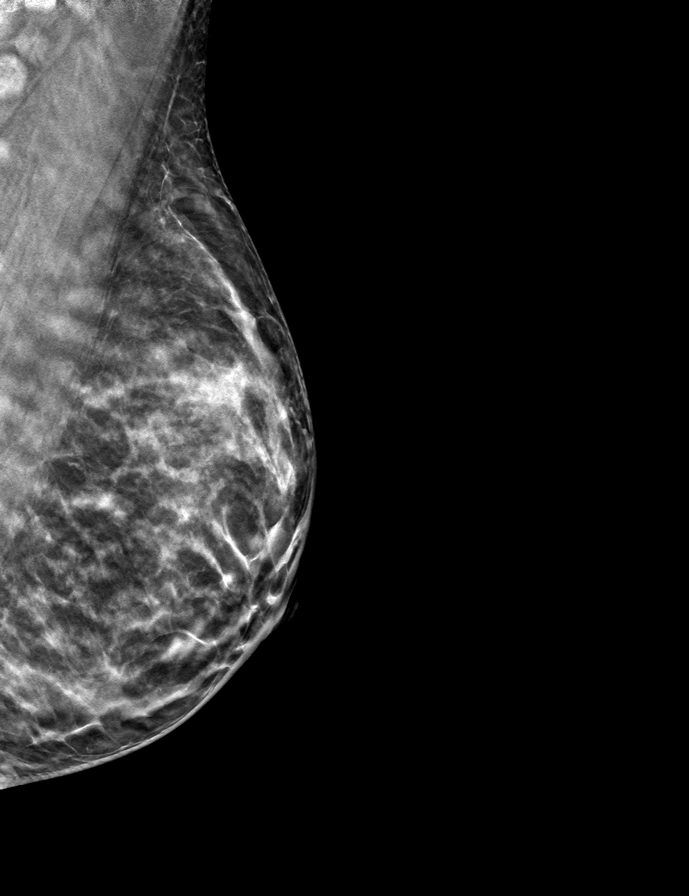

[9 of 24 positions shown; findings below may reference images not displayed]

ACR Breast Density Category c: The breast tissue is heterogeneously
dense, which may obscure small masses.
FINDINGS: There are no findings suspicious for malignancy.
IMPRESSION: No mammographic evidence of malignancy. A result letter of this
screening mammogram will be mailed directly to the patient.

RECOMMENDATION:
Screening mammogram in one year. (Code:Q3-W-BC3)

BI-RADS CATEGORY  1: Negative.

## 2022-06-02 NOTE — Progress Notes (Unsigned)
GYNECOLOGY OFFICE VISIT NOTE  History:   Amy Delacruz is a 46 y.o. [id number] here today for heavy periods.  ***  Of note, in her history, she does have SLE.    She denies any abnormal vaginal discharge, bleeding, pelvic pain or other concerns.     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 Overview:  neg CXR s/p tx   Perioral dermatitis 12/13/2017   Rosacea    Scoliosis    Seasonal allergies    Urticaria 08/2016   Urticaria, chronic 10/06/2016   Varicella    Vitamin D deficiency     Past Surgical History:  Procedure Laterality Date   DILATION AND CURETTAGE OF UTERUS  2004   2004,2007   SKIN LESION EXCISION     TONSILLECTOMY  1978   WISDOM TOOTH EXTRACTION  2000    The following portions of the patient's history were reviewed and updated as appropriate: allergies, current medications, past family history, past medical history, past social history, past surgical history and problem list.   Health Maintenance:   Normal pap and negative HRHPV:   Diagnosis  Date Value Ref Range Status  12/28/2019   Final   - Negative for intraepithelial lesion or malignancy (NILM)     Normal mammogram on 07/2021.   Review of Systems:  Pertinent items noted in HPI and remainder of comprehensive ROS otherwise negative.  Physical Exam:  There were no vitals taken for this visit. CONSTITUTIONAL: Well-developed,  well-nourished female in no acute distress.  HEENT:  Normocephalic, atraumatic. External right and left ear normal. No scleral icterus.  NECK: Normal range of motion, supple, no masses noted on observation SKIN: No rash noted. Not diaphoretic. No erythema. No pallor. MUSCULOSKELETAL: Normal range of motion. No edema noted. NEUROLOGIC: Alert and oriented to person, place, and time. Normal muscle tone coordination. No cranial nerve deficit noted. PSYCHIATRIC: Normal mood and affect. Normal behavior. Normal judgment and thought content.  CARDIOVASCULAR: Normal heart rate noted RESPIRATORY: Effort and breath sounds normal, no problems with respiration noted ABDOMEN: No masses noted. No other overt distention noted.    PELVIC: {Blank single:19197::"Deferred","Normal appearing external genitalia; normal urethral meatus; normal appearing vaginal mucosa and cervix.  No abnormal discharge noted.  Normal uterine size, no other palpable masses, no uterine or adnexal tenderness. Performed in the presence of a chaperone"}  Labs and Imaging No results found for this or any previous visit (from the past 168 hour(s)). No results found.   She had a normal pelvic US in 2019 Assessment and Plan:   1. Menorrhagia with regular cycle - Reviewed options for menorrhagia with regular cycle.  - She does not have any Rfs for uterine cancer so EMB not indicated at this time.  - Reviewed MEC guidelines - she has no known APS and no history of clots so she is  a candidate for any form of hormonal therapy.  - Discussed would recheck [place] since it has been 4 years. *** - Recheck CBC - last hgb was 15 but was in December *** - Options ultimately include: expectant management, TXA, hormonal therapy, surgery I.e. ablation or hysterectomy. Would first recommend TXA or hormonal therapy since no contraindication. *** - Pt would like ***    Diagnoses and all orders for this visit:  Menorrhagia with regular  cycle    Routine preventative health maintenance measures emphasized. Please refer to After Visit Summary for other counseling recommendations.   No follow-ups on file.  Milas Hock, MD, FACOG Obstetrician & Gynecologist, [facility] for [facility], [facility]

## 2022-06-04 ENCOUNTER — Ambulatory Visit (INDEPENDENT_AMBULATORY_CARE_PROVIDER_SITE_OTHER): Payer: 59 | Admitting: Obstetrics and Gynecology

## 2022-06-04 ENCOUNTER — Encounter: Payer: Self-pay | Admitting: Obstetrics and Gynecology

## 2022-06-04 VITALS — BP 100/69 | HR 81 | Ht 60.0 in | Wt 110.0 lb

## 2022-06-04 DIAGNOSIS — Z01419 Encounter for gynecological examination (general) (routine) without abnormal findings: Secondary | ICD-10-CM | POA: Diagnosis not present

## 2022-06-04 DIAGNOSIS — N920 Excessive and frequent menstruation with regular cycle: Secondary | ICD-10-CM | POA: Diagnosis not present

## 2022-06-04 MED ORDER — TRANEXAMIC ACID 650 MG PO TABS: 1300.0000 mg | ORAL_TABLET | Freq: Three times a day (TID) | ORAL | 6 refills | Status: DC

## 2022-08-06 ENCOUNTER — Ambulatory Visit (INDEPENDENT_AMBULATORY_CARE_PROVIDER_SITE_OTHER): Payer: 59

## 2022-08-06 DIAGNOSIS — Z1231 Encounter for screening mammogram for malignant neoplasm of breast: Secondary | ICD-10-CM | POA: Diagnosis not present

## 2022-08-06 DIAGNOSIS — Z01419 Encounter for gynecological examination (general) (routine) without abnormal findings: Secondary | ICD-10-CM

## 2022-08-26 ENCOUNTER — Ambulatory Visit (INDEPENDENT_AMBULATORY_CARE_PROVIDER_SITE_OTHER): Payer: 59 | Admitting: Family Medicine

## 2022-08-26 ENCOUNTER — Encounter: Payer: Self-pay | Admitting: Family Medicine

## 2022-08-26 VITALS — BP 113/76 | HR 73 | Temp 97.5°F | Ht 61.42 in | Wt 109.2 lb

## 2022-08-26 DIAGNOSIS — Z0000 Encounter for general adult medical examination without abnormal findings: Secondary | ICD-10-CM | POA: Diagnosis not present

## 2022-08-26 DIAGNOSIS — Z1231 Encounter for screening mammogram for malignant neoplasm of breast: Secondary | ICD-10-CM

## 2022-08-26 NOTE — Patient Instructions (Signed)

## 2022-08-26 NOTE — Progress Notes (Signed)
Patient ID: Amy Delacruz, female  DOB: May 26, 1976, 47 y.o.   MRN: [id number] Patient Care Team    Relationship Specialty Notifications Start End  Amy Leatherwood, DO PCP - General Family Medicine  10/06/16   Amy Fillers, MD    10/07/16    Comment: endocrine  Amy Penta, MD Referring Physician Gastroenterology  08/25/21   Amy Antigua, MD Consulting Physician Obstetrics and Gynecology  08/25/21   Amy Hams, MD Referring Physician Dermatology  08/25/21   Amy Becton, MD Consulting Physician Sports Medicine  08/25/21   Amy Fillers, MD  Endocrinology  08/26/22     Chief Complaint  Patient presents with   Annual Exam    Pt is fasting. Does not want labs done had labs done with endo.    Subjective: Amy Delacruz is a 46 y.o.  Female  present for CPE All past medical history, surgical history, allergies, family history, immunizations, medications and social history were updated in the electronic medical record today. All recent labs, ED visits and hospitalizations within the last year were reviewed.   Health maintenance:  Colonoscopy: Completed 06/24/2021>Amy Delacruz> 10 yr per pt.  Mammogram: completed: 08/06/2022. [facility] Cervical cancer screening: last pap: 05/2020 HPV completed by: Amy Delacruz. Rpt 3-5 yrs Immunizations: tdap UTD 12/2014, Influenza utd 2023 (encouraged yearly), covid series completed Infectious disease screening: HIV completed Hep c completed DEXA: DEXA 07/24/2020 normal. Patient has a Dental home. Hospitalizations/ED visits: reviewed     08/25/2021    9:10 AM 08/20/2020    9:38 AM 07/21/2019    8:06 AM 06/24/2018    2:02 PM 06/23/2017   10:55 AM  Depression screen PHQ 2/9  Decreased Interest 2 0 0 0   Down, Depressed, Hopeless 1 0 0 0 1  PHQ - 2 Score 3 0 0 0 1  Altered sleeping 3   1 0  Tired, decreased energy 2   1 1   Change in appetite 3   0 0  Feeling bad or failure about yourself  0   0 0  Trouble  concentrating 1   0 0  Moving slowly or fidgety/restless 0   0 1  Suicidal thoughts 0   0 0  PHQ-9 Score 12   2 3   Difficult doing work/chores    Somewhat difficult       08/25/2021    9:10 AM 07/21/2019    8:06 AM 06/24/2018    2:03 PM 10/06/2016   12:10 PM  GAD 7 : Generalized Anxiety Score  Nervous, Anxious, on Edge 2 2 1 3   Control/stop worrying 1 0 2 3  Worry too much - different things 2 3 1 3   Trouble relaxing 2 3 2  0  Restless 2 3 0 0  Easily annoyed or irritable 2 1 1 2   Afraid - awful might happen 1 1 1 2   Total GAD 7 Score 12 13 8 13   Anxiety Difficulty  Very difficult Somewhat difficult Not difficult at all    Immunization History  Administered Date(s) Administered   Influenza, Quadrivalent, Recombinant, Inj, Pf 07/26/2017, 07/03/2018   Influenza,inj,Quad PF,6+ Mos 08/03/2017, 08/02/2021, 08/08/2022   Influenza,inj,Quad PF,6-35 Mos 07/03/2018, 07/09/2019   Influenza,inj,quad, With Preservative 07/03/2018, 07/09/2019   Influenza-Unspecified 08/04/2010, 06/27/2015, 06/26/2016, 08/03/2017, 07/03/2018, 07/09/2019, 07/28/2020, 08/02/2021   PFIZER(Purple Top)SARS-COV-2 Vaccination 01/21/2020, 02/11/2020, 09/12/2020   Pfizer Covid-19 Vaccine Bivalent Booster 74yrs & up 07/13/2021   Tdap 12/05/2010, 01/02/2015  Past Medical History:  Diagnosis Date   Abnormal Pap smear of cervix 2007   Allergy    Depression with anxiety 2007   GERD (gastroesophageal reflux disease)    Graves disease    Graves' ophthalmopathy    follows with ophthalmology   Hearing difficulty of both ears 01/30/2020   History of positive PPD    cxr negative   Lumbar spondylosis with scoliosis    Lupus erythematosus    managed by dermatology    Migraine    Migraine, chronic, without aura 07/21/2019   Nonspecific reaction to 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 Overview:  neg CXR s/p tx   Perioral dermatitis  12/13/2017   Rosacea    Scoliosis    Seasonal allergies    Urticaria 08/2016   Urticaria, chronic 10/06/2016   Varicella    Vitamin D deficiency    Allergies  Allergen Reactions   Other Hives    Mandarin oranges   Past Surgical History:  Procedure Laterality Date   DILATION AND CURETTAGE OF UTERUS  2004   2004,2007   SKIN LESION EXCISION     TONSILLECTOMY  1978   WISDOM TOOTH EXTRACTION  2000   Family History  Problem Relation Age of Onset   Breast cancer Paternal Grandmother 19   Hypertension Mother    Lung disease Mother    Arthritis Mother    Hypertension Father    Bleeding Disorder Father    Stroke Maternal Grandmother    Hypertension Maternal Grandmother    Depression Sister    Depression Maternal Aunt    Social History   Social History Narrative   Married to Amy Delacruz. 2 children Amy Delacruz and Amy Delacruz.    SAHM. Post grad education.    Nonsmoker.    Drinks caffeine, takes a daily vitamin   Wears seatbelt, bicycle helmet, exercises routinely.    Smoke detector in the home.     Allergies as of 08/26/2022       Reactions   Other Hives   Mandarin oranges        Medication List        Accurate as of August 26, 2022  9:38 AM. If you have any questions, ask your nurse or doctor.          STOP taking these medications    tranexamic acid 650 MG Tabs tablet Commonly known as: LYSTEDA Stopped by: Amy Pacini, DO       TAKE these medications    eletriptan 20 MG tablet Commonly known as: RELPAX TAKE ONE TABLET BY MOUTH AT ONSET OF HEADACHE AND MAY REPEAT IN 2 HOURS IF NEEDED   METRONIDAZOLE (TOPICAL) 0.75 % Lotn APPLY TOPICALLY TWICE DAILY AS DIRECTED        All past medical history, surgical history, allergies, family history, immunizations andmedications were updated in the EMR today and reviewed under the history and medication portions of their EMR.     No results found for this or any previous visit (from the past 2160 hour(s)).  ROS: 14  pt review of systems performed and negative (unless mentioned in an HPI)  Objective: BP 113/76   Pulse 73   Temp (!) 97.5 F (36.4 C)   Ht 5' 1.42" (1.56 m)   Wt 109 lb 3.2 oz (49.5 kg)   LMP 07/09/2022   SpO2 100%   BMI 20.35 kg/m  Physical Exam Vitals and nursing note reviewed.  Constitutional:      General: She is not in acute distress.    Appearance: Normal appearance. She is not ill-appearing or toxic-appearing.  HENT:     Head: Normocephalic and atraumatic.     Right Ear: Tympanic membrane, ear canal and external ear normal. There is no impacted cerumen.     Left Ear: Tympanic membrane, ear canal and external ear normal. There is no impacted cerumen.     Nose: No congestion or rhinorrhea.     Mouth/Throat:     Mouth: Mucous membranes are moist.     Pharynx: Oropharynx is clear. No oropharyngeal exudate or posterior oropharyngeal erythema.  Eyes:     General: No scleral icterus.       Right eye: No discharge.        Left eye: No discharge.     Extraocular Movements: Extraocular movements intact.     Conjunctiva/sclera: Conjunctivae normal.     Pupils: Pupils are equal, round, and reactive to light.  Cardiovascular:     Rate and Rhythm: Normal rate and regular rhythm.     Pulses: Normal pulses.     Heart sounds: Normal heart sounds. No murmur heard.    No friction rub. No gallop.  Pulmonary:     Effort: Pulmonary effort is normal. No respiratory distress.     Breath sounds: Normal breath sounds. No stridor. No wheezing, rhonchi or rales.  Chest:     Chest wall: No tenderness.  Abdominal:     General: Abdomen is flat. Bowel sounds are normal. There is no distension.     Palpations: Abdomen is soft. There is no mass.     Tenderness: There is no abdominal tenderness. There is no right CVA tenderness, left CVA tenderness, guarding or rebound.     Hernia: No hernia is present.  Musculoskeletal:        General: No swelling, tenderness or deformity. Normal range of  motion.     Cervical back: Normal range of motion and neck supple. No rigidity or tenderness.     Right lower leg: No edema.     Left lower leg: No edema.  Lymphadenopathy:     Cervical: No cervical adenopathy.  Skin:    General: Skin is warm and dry.     Coloration: Skin is not jaundiced or pale.     Findings: No bruising, erythema, lesion or rash.  Neurological:     General: No focal deficit present.     Mental Status: She is alert and oriented to person, place, and time. Mental status is at baseline.     Cranial Nerves: No cranial nerve deficit.     Sensory: No sensory deficit.     Motor: No weakness.     Coordination: Coordination normal.     Gait: Gait normal.     Deep Tendon Reflexes: Reflexes normal.  Psychiatric:        Mood and Affect: Mood normal.        Behavior: Behavior normal.        Thought Content: Thought content normal.        Judgment: Judgment normal.      No results found.  Assessment/plan: Nicolena Schurman Pitt is a 46 y.o. female present for CPE/CMC Graves' disease Following with Dr. Morrison Old.  Reviewed labs collected  Encounter for screening mammogram for malignant neoplasm of breast - MM 3D SCREEN BREAST BILATERAL; Future Routine general medical examination at a health care facility Colonoscopy: Completed 06/24/2021>Amy Delacruz> 10 yr  per pt.  Mammogram: completed: 08/06/2022. [facility] Cervical cancer screening: last pap: 05/2020 HPV completed by: Dr. Hulan Fray. Rpt 3-5 yrs Immunizations: tdap UTD 12/2014, Influenza utd 2023 (encouraged yearly), covid series completed Infectious disease screening: HIV completed Hep c completed DEXA: DEXA 07/24/2020 normal. Patient was encouraged to exercise greater than 150 minutes a week. Patient was encouraged to choose a diet filled with fresh fruits and vegetables, and lean meats. AVS provided to patient today for education/recommendation on gender specific health and safety maintenance.  Return in about 1 year  (around 08/28/2023) for cpe (20 min).   Orders Placed This Encounter  Procedures   MM 3D SCREEN BREAST BILATERAL    No orders of the defined types were placed in this encounter.   Referral Orders  No referral(s) requested today     Electronically signed by: Howard Pouch, [place]

## 2022-12-11 ENCOUNTER — Encounter: Payer: Self-pay | Admitting: Family Medicine

## 2023-01-19 ENCOUNTER — Ambulatory Visit: Payer: 59 | Admitting: Sports Medicine

## 2023-01-19 ENCOUNTER — Ambulatory Visit (INDEPENDENT_AMBULATORY_CARE_PROVIDER_SITE_OTHER): Payer: 59

## 2023-01-19 DIAGNOSIS — M5416 Radiculopathy, lumbar region: Secondary | ICD-10-CM

## 2023-01-19 HISTORY — DX: Radiculopathy, lumbar region: M54.16

## 2023-01-19 NOTE — Progress Notes (Signed)
    Procedures performed today:    None.  Independent interpretation of notes and tests performed by another provider:   None.  Brief History, Exam, Impression, and Recommendations:    Right lumbar radiculopathy Very pleasant 47 year old female, axial low back pain with radiation down the right leg to the top of the right foot. She has had an MRI in the past that did show the right L4-L5 disc close to the right L4 nerve root in the extraforaminal location. She did well with gabapentin and conservative treatment, now she is having recurrence of discomfort, unable to use steroids as this seemed to worsen her liver function and LFTs. We will treat her again with home conditioning for the piriformis as well as for her herniated disc, she will do a gabapentin up taper. I would like baseline x-rays. Return to see me about 6 weeks and we can proceed with MRI of the lumbar spine if not significantly better.    ____________________________________________ Gwen Her. Dianah Field, M.D., ABFM., CAQSM., AME. Primary Care and Sports Medicine [facility]  Adjunct Professor of [place] [employer]  Risk manager

## 2023-01-19 NOTE — Assessment & Plan Note (Signed)
Very pleasant 47 year old female, axial low back pain with radiation down the right leg to the top of the right foot. She has had an MRI in the past that did show the right L4-L5 disc close to the right L4 nerve root in the extraforaminal location. She did well with gabapentin and conservative treatment, now she is having recurrence of discomfort, unable to use steroids as this seemed to worsen her liver function and LFTs. We will treat her again with home conditioning for the piriformis as well as for her herniated disc, she will do a gabapentin up taper. I would like baseline x-rays. Return to see me about 6 weeks and we can proceed with MRI of the lumbar spine if not significantly better.

## 2023-01-28 ENCOUNTER — Other Ambulatory Visit: Payer: Self-pay | Admitting: Ophthalmology

## 2023-01-28 DIAGNOSIS — H05242 Constant exophthalmos, left eye: Secondary | ICD-10-CM

## 2023-01-28 DIAGNOSIS — H05241 Constant exophthalmos, right eye: Secondary | ICD-10-CM

## 2023-02-01 ENCOUNTER — Other Ambulatory Visit: Payer: Self-pay | Admitting: Ophthalmology

## 2023-02-01 DIAGNOSIS — H05242 Constant exophthalmos, left eye: Secondary | ICD-10-CM

## 2023-02-01 DIAGNOSIS — H05241 Constant exophthalmos, right eye: Secondary | ICD-10-CM

## 2023-02-10 ENCOUNTER — Other Ambulatory Visit: Payer: Self-pay

## 2023-02-10 MED ORDER — ELETRIPTAN HYDROBROMIDE 20 MG PO TABS: ORAL_TABLET | ORAL | 5 refills | Status: DC

## 2023-02-11 ENCOUNTER — Encounter: Payer: Self-pay | Admitting: Sports Medicine

## 2023-02-11 DIAGNOSIS — M5416 Radiculopathy, lumbar region: Secondary | ICD-10-CM

## 2023-02-16 ENCOUNTER — Encounter: Payer: 59 | Admitting: Family Medicine

## 2023-02-16 NOTE — Progress Notes (Signed)
    Same day cancel- work related

## 2023-02-19 ENCOUNTER — Ambulatory Visit: Payer: 59 | Admitting: Family Medicine

## 2023-02-19 ENCOUNTER — Encounter: Payer: Self-pay | Admitting: Family Medicine

## 2023-02-19 VITALS — BP 111/73 | HR 88 | Temp 98.2°F | Wt 112.8 lb

## 2023-02-19 DIAGNOSIS — R350 Frequency of micturition: Secondary | ICD-10-CM | POA: Diagnosis not present

## 2023-02-19 DIAGNOSIS — R300 Dysuria: Secondary | ICD-10-CM | POA: Diagnosis not present

## 2023-02-19 DIAGNOSIS — R32 Unspecified urinary incontinence: Secondary | ICD-10-CM

## 2023-02-19 LAB — POC URINALSYSI DIPSTICK (AUTOMATED)
Bilirubin, UA: NEGATIVE
Blood, UA: NEGATIVE
Glucose, UA: NEGATIVE
Ketones, UA: NEGATIVE
Nitrite, UA: NEGATIVE
Protein, UA: NEGATIVE
Spec Grav, UA: 1.01 (ref 1.010–1.025)
Urobilinogen, UA: 0.2 E.U./dL
pH, UA: 6 (ref 5.0–8.0)

## 2023-02-19 MED ORDER — CEPHALEXIN 500 MG PO CAPS: 500.0000 mg | ORAL_CAPSULE | Freq: Three times a day (TID) | ORAL | 0 refills | Status: DC

## 2023-02-19 NOTE — Patient Instructions (Addendum)
Return if symptoms worsen or fail to improve.        Great to see you today.  I have refilled the medication(s) we provide.   If labs were collected, we will inform you of lab results once received either by echart message or telephone call.   - echart message- for normal results that have been seen by the patient already.   - telephone call: abnormal results or if patient has not viewed results in their echart.  

## 2023-02-19 NOTE — Progress Notes (Signed)
Amy GOGUEN , 06-Oct-1976, 47 y.o., female MRN: [id number] Patient Care Team    Relationship Specialty Notifications Start End  Natalia Leatherwood, DO PCP - General Family Medicine  10/06/16   Warden Fillers, MD    10/07/16    Comment: endocrine  Konrad Penta, MD Referring Physician Gastroenterology  08/25/21   Catalina Antigua, MD Consulting Physician Obstetrics and Gynecology  08/25/21   Kandice Hams, MD Referring Physician Dermatology  08/25/21   Monica Becton, MD Consulting Physician Sports Medicine  08/25/21   Warden Fillers, MD  Endocrinology  08/26/22     Chief Complaint  Patient presents with   Urinary Frequency    Abdominal pain started on tuesday     Subjective: Amy Delacruz is a 47 y.o. Pt presents for an OV with complaints of urinary frequnecy of 3 weeks duration.  Associated symptoms include earlier this week abd discomfort. Feeling more fatigued. Had a few palpitations the other day. Her thyroid med has been altered recently and she is est with endo.  She denies fever or chills. No prior h/o UTI or kidney stones.  She is  G2P2, both vaginal births.  She also endorses increase in urinary incontinence when exercising.      02/19/2023    7:56 AM 01/27/2023   10:38 AM 08/25/2021    9:10 AM 08/20/2020    9:38 AM 07/21/2019    8:06 AM  Depression screen PHQ 2/9  Decreased Interest 0 0 2 0 0  Down, Depressed, Hopeless 0 0 1 0 0  PHQ - 2 Score 0 0 3 0 0  Altered sleeping   3    Tired, decreased energy   2    Change in appetite   3    Feeling bad or failure about yourself    0    Trouble concentrating   1    Moving slowly or fidgety/restless   0    Suicidal thoughts   0    PHQ-9 Score   12      Allergies  Allergen Reactions   Other Hives    Mandarin oranges   Social History   Social History Narrative   Married to [place]. 2 children Amy Delacruz and Amy Delacruz.    SAHM. Post grad education.    Nonsmoker.    Drinks caffeine, takes a daily vitamin    Wears seatbelt, bicycle helmet, exercises routinely.    Smoke detector in the home.    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 Overview:  neg CXR s/p tx   Perioral dermatitis 12/13/2017   Rosacea    Scoliosis    Seasonal allergies    Urticaria 08/2016   Urticaria, chronic 10/06/2016   Varicella    Vitamin D deficiency    Past Surgical History:  Procedure Laterality  Date   DILATION AND CURETTAGE OF UTERUS  2004   2004,2007   SKIN LESION EXCISION     TONSILLECTOMY  1978   WISDOM TOOTH EXTRACTION  2000   Family History  Problem Relation Age of Onset   Breast cancer Paternal Grandmother 61   Hypertension Mother    Lung disease Mother    Arthritis Mother    Hypertension Father    Bleeding Disorder Father    Stroke Maternal Grandmother    Hypertension Maternal Grandmother    Depression Sister    Depression Maternal Aunt    Allergies as of 02/19/2023       Reactions   Other Hives   Mandarin oranges        Medication List        Accurate as of February 19, 2023  2:21 PM. If you have any questions, ask your nurse or doctor.          cephALEXin 500 MG capsule Commonly known as: KEFLEX Take 1 capsule (500 mg total) by mouth 3 (three) times daily. Started by: Felix Pacini, DO   eletriptan 20 MG tablet Commonly known as: RELPAX TAKE ONE TABLET BY MOUTH AT ONSET OF HEADACHE AND MAY REPEAT IN 2  HOURS IF NEEDED   gabapentin 300 MG capsule Commonly known as: NEURONTIN One tab PO qHS for a week, then BID for a week, then TID. May double weekly to a max of 3,600mg /day   METRONIDAZOLE (TOPICAL) 0.75 % Lotn APPLY TOPICALLY TWICE DAILY AS DIRECTED        All past medical history, surgical history, allergies, family history, immunizations andmedications were updated in the EMR today and reviewed under the history and medication portions of their EMR.     ROS Negative, with the exception of above mentioned in HPI   Objective:  BP 111/73   Pulse 88   Temp 98.2 F (36.8 C)   Wt 112 lb 12.8 oz (51.2 kg)   LMP 01/26/2023   SpO2 99%   BMI 21.02 kg/m  Body mass index is 21.02 kg/m. Physical Exam Vitals and nursing note reviewed.  Constitutional:      General: She is not in acute distress.    Appearance: Normal appearance. She is normal weight. She is not ill-appearing or toxic-appearing.  HENT:     Head: Normocephalic and atraumatic.  Eyes:     General: No scleral icterus.       Right eye: No discharge.        Left eye: No discharge.     Extraocular Movements: Extraocular movements intact.     Conjunctiva/sclera: Conjunctivae normal.     Pupils: Pupils are equal, round, and reactive to light.  Abdominal:     General: Bowel sounds are normal. There is no distension.     Tenderness: There is no abdominal tenderness. There is no right CVA tenderness or left CVA tenderness.  Skin:    Findings: No rash.  Neurological:     Mental Status: She is alert and oriented to person, place, and time. Mental status is at baseline.     Motor: No weakness.     Coordination: Coordination normal.     Gait: Gait normal.  Psychiatric:        Mood and Affect: Mood normal.        Behavior: Behavior normal.        Thought Content: Thought content normal.        Judgment: Judgment normal.  No results found. No results found. Results for orders placed or performed in visit on  02/19/23 (from the past 24 hour(s))  POCT Urinalysis Dipstick (Automated)     Status: Abnormal   Collection Time: 02/19/23  7:54 AM  Result Value Ref Range   Color, UA yellow    Clarity, UA clear    Glucose, UA Negative Negative   Bilirubin, UA neg    Ketones, UA neg    Spec Grav, UA 1.010 1.010 - 1.025   Blood, UA neg    pH, UA 6.0 5.0 - 8.0   Protein, UA Negative Negative   Urobilinogen, UA 0.2 0.2 or 1.0 E.U./dL   Nitrite, UA neg    Leukocytes, UA Moderate (2+) (A) Negative    Assessment/Plan: Tanique D Askin is a 47 y.o. female present for OV for  Dysuria/Urinary incontinence, unspecified type/frequncy - POCT Urinalysis Dipstick (Automated) - Urinalysis w microscopic + reflex cultur - 2+leuks, - bld and nitrates Elected to tx with keflex tid, while we wait on cx.  We discussed ddx of bladder prolapse, stress incontinence etc. She understands that would require GYN.   Reviewed expectations re: course of current medical issues. Discussed self-management of symptoms. Outlined signs and symptoms indicating need for more acute intervention. Patient verbalized understanding and all questions were answered. Patient received an After-Visit Summary.    Orders Placed This Encounter  Procedures   Urinalysis w microscopic + reflex cultur   POCT Urinalysis Dipstick (Automated)   Meds ordered this encounter  Medications   cephALEXin (KEFLEX) 500 MG capsule    Sig: Take 1 capsule (500 mg total) by mouth 3 (three) times daily.    Dispense:  21 capsule    Refill:  0   Referral Orders  No referral(s) requested today     Note is dictated utilizing voice recognition software. Although note has been proof read prior to signing, occasional typographical errors still can be missed. If any questions arise, please do not hesitate to call for verification.   electronically signed by:  Felix Pacini, DO  [facility] [place]

## 2023-02-21 LAB — URINALYSIS W MICROSCOPIC + REFLEX CULTURE
Bacteria, UA: NONE SEEN /HPF
Bilirubin Urine: NEGATIVE
Glucose, UA: NEGATIVE
Hgb urine dipstick: NEGATIVE
Hyaline Cast: NONE SEEN /LPF
Ketones, ur: NEGATIVE
Nitrites, Initial: NEGATIVE
Protein, ur: NEGATIVE
RBC / HPF: NONE SEEN /HPF (ref 0–2)
Specific Gravity, Urine: 1.008 (ref 1.001–1.035)
WBC, UA: NONE SEEN /HPF (ref 0–5)
pH: 6 (ref 5.0–8.0)

## 2023-02-21 LAB — URINE CULTURE
MICRO NUMBER:: 14881357
Result:: NO GROWTH
SPECIMEN QUALITY:: ADEQUATE

## 2023-02-21 LAB — CULTURE INDICATED

## 2023-02-25 MED ORDER — GABAPENTIN 300 MG PO CAPS: ORAL_CAPSULE | ORAL | 3 refills | Status: DC

## 2023-03-02 ENCOUNTER — Ambulatory Visit: Payer: 59 | Admitting: Sports Medicine

## 2023-06-17 ENCOUNTER — Ambulatory Visit (INDEPENDENT_AMBULATORY_CARE_PROVIDER_SITE_OTHER): Payer: 59 | Admitting: Obstetrics and Gynecology

## 2023-06-17 VITALS — BP 105/65 | HR 73 | Ht 61.0 in | Wt 112.0 lb

## 2023-06-17 DIAGNOSIS — Z1339 Encounter for screening examination for other mental health and behavioral disorders: Secondary | ICD-10-CM

## 2023-06-17 DIAGNOSIS — Z139 Encounter for screening, unspecified: Secondary | ICD-10-CM

## 2023-06-17 NOTE — Progress Notes (Signed)
ANNUAL EXAM Patient name: Amy Delacruz MRN [id number]  Date of birth: 1975-11-13 Chief Complaint:   No chief complaint on file.  History of Present Illness:   Sameka D Gittens is a 46 y.o. G4W1027 with No LMP recorded. being seen today for a routine annual exam.  Current complaints: Periods are spacing out  Last pap 12/28/19. Results were: NILM w/ HRHPV negative. H/O abnormal pap: yes ~18 years ago just needed a closer interval pap, no colpo/LEEP Last mammogram: 08/06/22. Results were: normal.  Last colonoscopy: 2023. Results were: normal HPV vaccine: no     02/19/2023    7:56 AM 01/27/2023   10:38 AM 08/25/2021    9:10 AM 08/20/2020    9:38 AM 07/21/2019    8:06 AM  Depression screen PHQ 2/9  Decreased Interest 0 0 2 0 0  Down, Depressed, Hopeless 0 0 1 0 0  PHQ - 2 Score 0 0 3 0 0  Altered sleeping   3    Tired, decreased energy   2    Change in appetite   3    Feeling bad or failure about yourself    0    Trouble concentrating   1    Moving slowly or fidgety/restless   0    Suicidal thoughts   0    PHQ-9 Score   12          08/25/2021    9:10 AM 07/21/2019    8:06 AM 06/24/2018    2:03 PM 10/06/2016   12:10 PM  GAD 7 : Generalized Anxiety Score  Nervous, Anxious, on Edge 2 2 1 3   Control/stop worrying 1 0 2 3  Worry too much - different things 2 3 1 3   Trouble relaxing 2 3 2  0  Restless 2 3 0 0  Easily annoyed or irritable 2 1 1 2   Afraid - awful might happen 1 1 1 2   Total GAD 7 Score 12 13 8 13   Anxiety Difficulty  Very difficult Somewhat difficult Not difficult at all     Review of Systems:   Pertinent items are noted in HPI Denies any headaches, blurred vision, fatigue, shortness of breath, chest pain, abdominal pain, abnormal vaginal discharge/itching/odor/irritation, problems with periods, bowel movements, urination, or intercourse unless otherwise stated above. Pertinent History Reviewed:  Reviewed past medical,surgical, social and family history.  Reviewed  problem list, medications and allergies. Physical Assessment:   Vitals:   06/17/23 1530  BP: 105/65  Pulse: 73  Weight: 112 lb (50.8 kg)  Height: 5\' 1"  (1.549 m)  Body mass index is 21.16 kg/m.        Physical Examination:   General appearance - well appearing, and in no distress  Mental status - alert, oriented to person, place, and time  Chest - respiratory effort normal  Heart - normal peripheral perfusion  Breasts - breasts appear normal, no suspicious masses, no skin or nipple changes or axillary nodes  Abdomen - soft, nontender, nondistended, no masses or organomegaly  Pelvic - deferred after discussion of r/b  Chaperone present for exam  No results found for this or any previous visit (from the past 24 hour(s)).  Assessment & Plan:  1) Well-Woman Exam Mammogram: ordered, to schedule in October Colonoscopy: per GI due 2033 Pap: UTD, next due 2026 Gardasil: n/a GC/CT: declines HIV/HCV: declines Discussed "red flag" bleeding patterns that need more testing beyond attributing to perimenopause   Labs/procedures today:  Orders Placed This Encounter  Procedures   MM 3D SCREENING MAMMOGRAM BILATERAL BREAST    Meds: No orders of the defined types were placed in this encounter.   Follow-up: Return in about 1 year (around 06/16/2024) for annual exam.  Lennart Pall, MD 06/17/2023 5:00 PM

## 2023-08-18 ENCOUNTER — Ambulatory Visit: Payer: 59

## 2023-08-18 DIAGNOSIS — Z1231 Encounter for screening mammogram for malignant neoplasm of breast: Secondary | ICD-10-CM

## 2023-08-18 DIAGNOSIS — Z139 Encounter for screening, unspecified: Secondary | ICD-10-CM

## 2023-08-31 ENCOUNTER — Ambulatory Visit: Payer: 59 | Admitting: Family Medicine

## 2023-08-31 ENCOUNTER — Encounter: Payer: Self-pay | Admitting: Family Medicine

## 2023-08-31 VITALS — BP 110/80 | HR 91 | Temp 97.7°F | Ht 61.0 in | Wt 113.4 lb

## 2023-08-31 DIAGNOSIS — Z13220 Encounter for screening for lipoid disorders: Secondary | ICD-10-CM

## 2023-08-31 DIAGNOSIS — Z0000 Encounter for general adult medical examination without abnormal findings: Secondary | ICD-10-CM | POA: Diagnosis not present

## 2023-08-31 LAB — CBC
HCT: 43.8 % (ref 36.0–46.0)
Hemoglobin: 14.2 g/dL (ref 12.0–15.0)
MCHC: 32.5 g/dL (ref 30.0–36.0)
MCV: 94 fL (ref 78.0–100.0)
Platelets: 214 10*3/uL (ref 150.0–400.0)
RBC: 4.67 Mil/uL (ref 3.87–5.11)
RDW: 13.9 % (ref 11.5–15.5)
WBC: 5.4 10*3/uL (ref 4.0–10.5)

## 2023-08-31 LAB — LIPID PANEL
Cholesterol: 185 mg/dL (ref 0–200)
HDL: 70.8 mg/dL (ref >39.00)
LDL Cholesterol: 98 mg/dL (ref 0–99)
NonHDL: 114.31
Total CHOL/HDL Ratio: 3
Triglycerides: 82 mg/dL (ref 0.0–149.0)
VLDL: 16.4 mg/dL (ref 0.0–40.0)

## 2023-08-31 NOTE — Patient Instructions (Addendum)
Return in about 1 year (around 08/31/2024) for cpe (20 min).        Great to see you today.  I have refilled the medication(s) we provide.   If labs were collected or images ordered, we will inform you of  results once we have received them and reviewed. We will contact you either by echart message, or telephone call.  Please give ample time to the testing facility, and our office to run,  receive and review results. Please do not call inquiring of results, even if you can see them in your chart. We will contact you as soon as we are able. If it has been over 1 week since the test was completed, and you have not yet heard from [place], then please call us.    - echart message- for normal results that have been seen by the patient already.   - telephone call: abnormal results or if patient has not viewed results in their echart.  If a referral to a specialist was entered for you, please call us in 2 weeks if you have not heard from the specialist office to schedule.

## 2023-08-31 NOTE — Progress Notes (Addendum)
Patient ID: Amy Delacruz, female  DOB: 01-19-1976, 47 y.o.   MRN: [id number] Patient Care Team    Relationship Specialty Notifications Start End  Natalia Leatherwood, DO PCP - General Family Medicine  10/06/16   Warden Fillers, MD    10/07/16    Comment: endocrine  Konrad Penta, MD Referring Physician Gastroenterology  08/25/21   Kandice Hams, MD Referring Physician Dermatology  08/25/21   Monica Becton, MD Consulting Physician Sports Medicine  08/25/21   Warden Fillers, MD  Endocrinology  08/26/22   Lennart Pall, MD Consulting Physician Obstetrics and Gynecology  08/31/23     Chief Complaint  Patient presents with   Annual Exam    Pt is fasting;    Subjective: Amy Delacruz is a 47 y.o.  Female  present for CPE and patient desired add on labs.  All past medical history, surgical history, allergies, family history, immunizations, medications and social history were updated in the electronic medical record today. All recent labs, ED visits and hospitalizations within the last year were reviewed.   Health maintenance:  Colonoscopy: Completed 06/24/2021>Dr. Bray> 10 yr per pt.  Mammogram: completed: 08/18/2023. - ordered by GYN Cervical cancer screening: last pap: 12/28/2019 - gyn Immunizations: tdap UTD 12/2014, Influenza completed (encouraged yearly), covid series completed Infectious disease screening: HIV completed Hep c completed DEXA: DEXA 07/24/2020 normal. Patient has a Dental home. Hospitalizations/ED visits: reviewed     02/19/2023    7:56 AM 01/27/2023   10:38 AM 08/25/2021    9:10 AM 08/20/2020    9:38 AM 07/21/2019    8:06 AM  Depression screen PHQ 2/9  Decreased Interest 0 0 2 0 0  Down, Depressed, Hopeless 0 0 1 0 0  PHQ - 2 Score 0 0 3 0 0  Altered sleeping   3    Tired, decreased energy   2    Change in appetite   3    Feeling bad or failure about yourself    0    Trouble concentrating   1    Moving slowly or fidgety/restless   0     Suicidal thoughts   0    PHQ-9 Score   12        08/25/2021    9:10 AM 07/21/2019    8:06 AM 06/24/2018    2:03 PM 10/06/2016   12:10 PM  GAD 7 : Generalized Anxiety Score  Nervous, Anxious, on Edge 2 2 1 3   Control/stop worrying 1 0 2 3  Worry too much - different things 2 3 1 3   Trouble relaxing 2 3 2  0  Restless 2 3 0 0  Easily annoyed or irritable 2 1 1 2   Afraid - awful might happen 1 1 1 2   Total GAD 7 Score 12 13 8 13   Anxiety Difficulty  Very difficult Somewhat difficult Not difficult at all    Immunization History  Administered Date(s) Administered   Influenza, Quadrivalent, Recombinant, Inj, Pf 07/26/2017, 07/03/2018   Influenza,inj,Quad PF,6+ Mos 08/03/2017, 08/02/2021, 08/08/2022   Influenza,inj,Quad PF,6-35 Mos 07/03/2018, 07/09/2019   Influenza,inj,quad, With Preservative 07/03/2018, 07/09/2019   Influenza-Unspecified 08/04/2010, 06/27/2015, 06/26/2016, 08/03/2017, 07/03/2018, 07/09/2019, 07/28/2020, 08/02/2021, 07/27/2023   PFIZER(Purple Top)SARS-COV-2 Vaccination 01/21/2020, 02/11/2020, 09/12/2020   Pfizer Covid-19 Vaccine Bivalent Booster 19yrs & up 07/13/2021   Tdap 12/05/2010, 01/02/2015   Unspecified SARS-COV-2 Vaccination 07/27/2023    Past Medical History:  Diagnosis Date   Abnormal Pap smear  of cervix 2007   Allergy    Depression with anxiety 2007   GERD (gastroesophageal reflux disease)    Graves disease    Graves' ophthalmopathy    follows with ophthalmology   Hearing difficulty of both ears 01/30/2020   History of positive PPD    cxr negative   Lumbar spondylosis with scoliosis    Lupus erythematosus    managed by dermatology    Migraine    Migraine, chronic, without aura 07/21/2019   Nonspecific reaction to 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 Overview:  neg CXR s/p tx   Perioral dermatitis 12/13/2017   Rosacea    Scoliosis    Seasonal allergies     Urticaria 08/2016   Urticaria, chronic 10/06/2016   Varicella    Vitamin D deficiency    Allergies  Allergen Reactions   Other Hives    Mandarin oranges   Past Surgical History:  Procedure Laterality Date   DILATION AND CURETTAGE OF UTERUS  2004   2004,2007   SKIN LESION EXCISION     TONSILLECTOMY  1978   WISDOM TOOTH EXTRACTION  2000   Family History  Problem Relation Age of Onset   Breast cancer Paternal Grandmother 21   Hypertension Mother    Lung disease Mother    Arthritis Mother    Hypertension Father    Bleeding Disorder Father    Stroke Maternal Grandmother    Hypertension Maternal Grandmother    Depression Sister    Depression Maternal Aunt    Social History   Social History Narrative   Married to [place]. 2 children [name] and [place].    SAHM. Post grad education.    Nonsmoker.    Drinks caffeine, takes a daily vitamin   Wears seatbelt, bicycle helmet, exercises routinely.    Smoke detector in the home.     Allergies as of 08/31/2023       Reactions   Other Hives   Mandarin oranges        Medication List        Accurate as of August 31, 2023  8:23 AM. If you have any questions, ask your nurse or doctor.          eletriptan 20 MG tablet Commonly known as: RELPAX TAKE ONE TABLET BY MOUTH AT ONSET OF HEADACHE AND MAY REPEAT IN 2 HOURS IF NEEDED   escitalopram 20 MG tablet Commonly known as: LEXAPRO Take 20 mg by mouth daily.   methimazole 5 MG tablet Commonly known as: TAPAZOLE   METRONIDAZOLE (TOPICAL) 0.75 % Lotn in the morning and at bedtime. What changed: Another medication with the same name was removed. Continue taking this medication, and follow the directions you see here. Changed by: Felix Pacini        All past medical history, surgical history, allergies, family history, immunizations andmedications were updated in the EMR today and reviewed under the history and medication portions of their EMR.     No results found  for this or any previous visit (from the past 2160 hour(s)).  ROS: 14 pt review of systems performed and negative (unless mentioned in an HPI)  Objective: BP 110/80   Pulse 91   Temp 97.7 F (36.5 C)   Ht 5\' 1"  (1.549 m)   Wt 113 lb 6.4 oz (51.4 kg)   LMP 08/10/2023   SpO2 98%   BMI 21.43 kg/m  Physical Exam  Vitals and nursing note reviewed.  Constitutional:      General: She is not in acute distress.    Appearance: Normal appearance. She is not ill-appearing or toxic-appearing.  HENT:     Head: Normocephalic and atraumatic.     Right Ear: Tympanic membrane, ear canal and external ear normal. There is no impacted cerumen.     Left Ear: Tympanic membrane, ear canal and external ear normal. There is no impacted cerumen.     Nose: No congestion or rhinorrhea.     Mouth/Throat:     Mouth: Mucous membranes are moist.     Pharynx: Oropharynx is clear. No oropharyngeal exudate or posterior oropharyngeal erythema.  Eyes:     General: No scleral icterus.       Right eye: No discharge.        Left eye: No discharge.     Extraocular Movements: Extraocular movements intact.     Conjunctiva/sclera: Conjunctivae normal.     Pupils: Pupils are equal, round, and reactive to light.  Neck:     Comments: Moderate sized right thyroid cyst palpated. Cardiovascular:     Rate and Rhythm: Normal rate and regular rhythm.     Pulses: Normal pulses.     Heart sounds: Normal heart sounds. No murmur heard.    No friction rub. No gallop.  Pulmonary:     Effort: Pulmonary effort is normal. No respiratory distress.     Breath sounds: Normal breath sounds. No stridor. No wheezing, rhonchi or rales.  Chest:     Chest wall: No tenderness.  Abdominal:     General: Abdomen is flat. Bowel sounds are normal. There is no distension.     Palpations: Abdomen is soft. There is no mass.     Tenderness: There is no abdominal tenderness. There is no right CVA tenderness, left CVA tenderness, guarding or rebound.      Hernia: No hernia is present.  Musculoskeletal:        General: No swelling, tenderness or deformity. Normal range of motion.     Cervical back: Normal range of motion and neck supple. No rigidity or tenderness.     Right lower leg: No edema.     Left lower leg: No edema.  Lymphadenopathy:     Cervical: No cervical adenopathy.  Skin:    General: Skin is warm and dry.     Coloration: Skin is not jaundiced or pale.     Findings: No bruising, erythema, lesion or rash.  Neurological:     General: No focal deficit present.     Mental Status: She is alert and oriented to person, place, and time. Mental status is at baseline.     Cranial Nerves: No cranial nerve deficit.     Sensory: No sensory deficit.     Motor: No weakness.     Coordination: Coordination normal.     Gait: Gait normal.     Deep Tendon Reflexes: Reflexes normal.  Psychiatric:        Mood and Affect: Mood normal.        Behavior: Behavior normal.        Thought Content: Thought content normal.        Judgment: Judgment normal.     No results found.  Assessment/plan: Amy Delacruz is a 47 y.o. female present for cpe Routine general medical examination at a health care facility Patient was encouraged to exercise greater than 150 minutes a week. Patient was encouraged to choose  a diet filled with fresh fruits and vegetables, and lean meats. AVS provided to patient today for education/recommendation on gender specific health and safety maintenance. Colonoscopy: Completed 06/24/2021>Dr. Bray> 10 yr per pt.  Mammogram: completed: 08/18/2023. - ordered by GYN Cervical cancer screening: last pap: 12/28/2019 - gyn Immunizations: tdap UTD 12/2014, Influenza completed (encouraged yearly), covid series completed Infectious disease screening: HIV completed Hep c completed DEXA: DEXA 07/24/2020 normal.  Return in about 1 year (around 08/31/2024) for cpe (20 min).   Orders Placed This Encounter  Procedures   Lipid panel    CBC    No orders of the defined types were placed in this encounter.   Referral Orders  No referral(s) requested today     Electronically signed by: Felix Pacini, DO [facility] [place]

## 2023-09-21 ENCOUNTER — Ambulatory Visit: Payer: 59 | Admitting: Sports Medicine

## 2023-09-28 ENCOUNTER — Ambulatory Visit: Payer: 59 | Admitting: Sports Medicine

## 2023-11-09 ENCOUNTER — Ambulatory Visit: Payer: 59 | Admitting: Sports Medicine

## 2023-12-09 ENCOUNTER — Encounter: Payer: Self-pay | Admitting: Family Medicine

## 2023-12-09 ENCOUNTER — Ambulatory Visit: Payer: 59 | Admitting: Family Medicine

## 2023-12-09 VITALS — BP 112/74 | HR 78 | Temp 97.9°F | Wt 115.0 lb

## 2023-12-09 DIAGNOSIS — E0500 Thyrotoxicosis with diffuse goiter without thyrotoxic crisis or storm: Secondary | ICD-10-CM | POA: Diagnosis not present

## 2023-12-09 DIAGNOSIS — F411 Generalized anxiety disorder: Secondary | ICD-10-CM

## 2023-12-09 MED ORDER — VENLAFAXINE HCL ER 37.5 MG PO CP24: 37.5000 mg | ORAL_CAPSULE | Freq: Every day | ORAL | 0 refills | Status: DC

## 2023-12-09 MED ORDER — VENLAFAXINE HCL ER 75 MG PO CP24
75.0000 mg | ORAL_CAPSULE | Freq: Every day | ORAL | 1 refills | Status: DC
Start: 2023-12-16 — End: 2024-01-20

## 2023-12-09 NOTE — Progress Notes (Signed)
Amy Delacruz , July 01, 1976, 48 y.o., female MRN: [id number] Patient Care Team    Relationship Specialty Notifications Start End  Natalia Leatherwood, DO PCP - General Family Medicine  10/06/16   Warden Fillers, MD    10/07/16    Comment: endocrine  Konrad Penta, MD Referring Physician Gastroenterology  08/25/21   Kandice Hams, MD Referring Physician Dermatology  08/25/21   Monica Becton, MD Consulting Physician Sports Medicine  08/25/21   Warden Fillers, MD  Endocrinology  08/26/22   Lennart Pall, MD Consulting Physician Obstetrics and Gynecology  08/31/23     Chief Complaint  Patient presents with   Anxiety    Pt wants to discuss discontinuing escitalopram (LEXAPRO) 20 MG tablet. Wants to discuss new medication     Subjective: Amy Delacruz is a 48 y.o. Pt presents for an OV to discuss anxiety and medications. She has been prescribed lexapro 1-20 mg off/on since 2017. She reported initially tolerating medication w/out SE, but later has noticed decreased libido SE. She would like to consider other medication.  She had been tapering off lexapro for 6 week alternating doses 20/10, then 20 every other day.  Since doing so she noticed her anxiety is resurfacing.      12/09/2023    7:49 AM 02/19/2023    7:56 AM 01/27/2023   10:38 AM 08/25/2021    9:10 AM 08/20/2020    9:38 AM  Depression screen PHQ 2/9  Decreased Interest 0 0 0 2 0  Down, Depressed, Hopeless 0 0 0 1 0  PHQ - 2 Score 0 0 0 3 0  Altered sleeping 0   3   Tired, decreased energy 1   2   Change in appetite 0   3   Feeling bad or failure about yourself  0   0   Trouble concentrating 0   1   Moving slowly or fidgety/restless 0   0   Suicidal thoughts 0   0   PHQ-9 Score 1   12   Difficult doing work/chores Not difficult at all        Allergies  Allergen Reactions   Other Hives    Mandarin oranges   Social History   Social History Narrative   Married to [place]. 2 children [name] and  [place].    SAHM. Post grad education.    Nonsmoker.    Drinks caffeine, takes a daily vitamin   Wears seatbelt, bicycle helmet, exercises routinely.    Smoke detector in the home.    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 Overview:  neg CXR s/p tx   Perioral dermatitis 12/13/2017   Rosacea    Scoliosis    Seasonal allergies    Urticaria 08/2016   Urticaria, chronic 10/06/2016   Varicella  Vitamin D deficiency    Past Surgical History:  Procedure Laterality Date   DILATION AND CURETTAGE OF UTERUS  2004   2004,2007   SKIN LESION EXCISION     TONSILLECTOMY  1978   WISDOM TOOTH EXTRACTION  2000   Family History  Problem Relation Age of Onset   Breast cancer Paternal Grandmother 54   Hypertension Mother    Lung disease Mother    Arthritis Mother    Hypertension Father    Bleeding Disorder Father    Stroke Maternal Grandmother    Hypertension Maternal Grandmother    Depression Sister    Depression Maternal Aunt    Allergies as of 12/09/2023       Reactions   Other Hives   Mandarin oranges        Medication List        Accurate as of December 09, 2023 11:23 AM. If you have any questions, ask your nurse or doctor.          eletriptan 20 MG tablet Commonly known as: RELPAX TAKE ONE TABLET BY MOUTH AT ONSET OF HEADACHE AND MAY REPEAT IN 2 HOURS IF NEEDED   escitalopram 20 MG  tablet Commonly known as: LEXAPRO Take 20 mg by mouth daily.   Flowflex COVID-19 Ag Home Test Kit Generic drug: COVID-19 At Home Antigen Test FOLLOW INSTRUCTIONS INCLUDED WITH THE PACKAGE.   methimazole 5 MG tablet Commonly known as: TAPAZOLE   METRONIDAZOLE (TOPICAL) 0.75 % Lotn in the morning and at bedtime.   venlafaxine XR 37.5 MG 24 hr capsule Commonly known as: Effexor XR Take 1 capsule (37.5 mg total) by mouth daily with breakfast. Started by: Felix Pacini   venlafaxine XR 75 MG 24 hr capsule Commonly known as: Effexor XR Take 1 capsule (75 mg total) by mouth daily with breakfast. Start taking on: December 16, 2023 Started by: Felix Pacini        All past medical history, surgical history, allergies, family history, immunizations andmedications were updated in the EMR today and reviewed under the history and medication portions of their EMR.     ROS Negative, with the exception of above mentioned in HPI   Objective:  BP 112/74   Pulse 78   Temp 97.9 F (36.6 C)   Wt 115 lb (52.2 kg)   SpO2 98%   BMI 21.73 kg/m  Body mass index is 21.73 kg/m. Physical Exam Vitals and nursing note reviewed.  Constitutional:      General: She is not in acute distress.    Appearance: Normal appearance. She is normal weight. She is not ill-appearing or toxic-appearing.  HENT:     Head: Normocephalic and atraumatic.  Eyes:     General: No scleral icterus.       Right eye: No discharge.        Left eye: No discharge.     Extraocular Movements: Extraocular movements intact.     Conjunctiva/sclera: Conjunctivae normal.     Pupils: Pupils are equal, round, and reactive to light.  Skin:    Findings: No rash.  Neurological:     Mental Status: She is alert and oriented to person, place, and time. Mental status is at baseline.     Motor: No weakness.     Coordination: Coordination normal.     Gait: Gait normal.  Psychiatric:        Mood and Affect: Mood normal.         Behavior: Behavior normal.  Thought Content: Thought content normal.        Judgment: Judgment normal.     No results found. No results found. No results found for this or any previous visit (from the past 24 hours).  Assessment/Plan: Amy Delacruz is a 48 y.o. female present for OV for  Anxiety state (Primary)/Graves' disease We discussed options on altering her treatment regimen and agreed to the following: -Decrease Lexapro to 10 mg daily for 7 days, while starting Effexor 37.5 mg - Stop Lexapro on day 8, and increase Effexor to 75 mg daily - Hopefully the Effexor will also have a positive benefit with her migraine headaches. - Patient understands never stop medication abruptly, this medication must be weaned off slowly if she decides to discontinue. Follow-up in 6 weeks virtually okay  Reviewed expectations re: course of current medical issues. Discussed self-management of symptoms. Outlined signs and symptoms indicating need for more acute intervention. Patient verbalized understanding and all questions were answered. Patient received an After-Visit Summary.    No orders of the defined types were placed in this encounter.  Meds ordered this encounter  Medications   venlafaxine XR (EFFEXOR XR) 37.5 MG 24 hr capsule    Sig: Take 1 capsule (37.5 mg total) by mouth daily with breakfast.    Dispense:  7 capsule    Refill:  0    Discontinue   venlafaxine XR (EFFEXOR XR) 75 MG 24 hr capsule    Sig: Take 1 capsule (75 mg total) by mouth daily with breakfast.    Dispense:  90 capsule    Refill:  1   Referral Orders  No referral(s) requested today     Note is dictated utilizing voice recognition software. Although note has been proof read prior to signing, occasional typographical errors still can be missed. If any questions arise, please do not hesitate to call for verification.   electronically signed by:  Felix Pacini, DO  [facility] - [place]

## 2023-12-09 NOTE — Patient Instructions (Addendum)
Return in about 6 weeks (around 01/20/2024) for Routine chronic condition follow-up - virtual.        Great to see you today.  I have refilled the medication(s) we provide.   If labs were collected or images ordered, we will inform you of  results once we have received them and reviewed. We will contact you either by echart message, or telephone call.  Please give ample time to the testing facility, and our office to run,  receive and review results. Please do not call inquiring of results, even if you can see them in your chart. We will contact you as soon as we are able. If it has been over 1 week since the test was completed, and you have not yet heard from [place], then please call us.    - echart message- for normal results that have been seen by the patient already.   - telephone call: abnormal results or if patient has not viewed results in their echart.  If a referral to a specialist was entered for you, please call us in 2 weeks if you have not heard from the specialist office to schedule.

## 2024-01-20 ENCOUNTER — Telehealth: Payer: 59 | Admitting: Family Medicine

## 2024-01-20 ENCOUNTER — Encounter: Payer: Self-pay | Admitting: Family Medicine

## 2024-01-20 DIAGNOSIS — F411 Generalized anxiety disorder: Secondary | ICD-10-CM | POA: Diagnosis not present

## 2024-01-20 DIAGNOSIS — E0500 Thyrotoxicosis with diffuse goiter without thyrotoxic crisis or storm: Secondary | ICD-10-CM

## 2024-01-20 MED ORDER — VENLAFAXINE HCL ER 150 MG PO CP24
150.0000 mg | ORAL_CAPSULE | Freq: Every day | ORAL | 1 refills | Status: DC
Start: 2024-01-20 — End: 2024-02-15

## 2024-01-20 NOTE — Patient Instructions (Addendum)
 Return in about 24 weeks (around 07/06/2024).        Great to see you today.  I have refilled the medication(s) we provide.   If labs were collected or images ordered, we will inform you of  results once we have received them and reviewed. We will contact you either by echart message, or telephone call.  Please give ample time to the testing facility, and our office to run,  receive and review results. Please do not call inquiring of results, even if you can see them in your chart. We will contact you as soon as we are able. If it has been over 1 week since the test was completed, and you have not yet heard from [place], then please call us.    - echart message- for normal results that have been seen by the patient already.   - telephone call: abnormal results or if patient has not viewed results in their echart.  If a referral to a specialist was entered for you, please call us in 2 weeks if you have not heard from the specialist office to schedule.

## 2024-01-20 NOTE — Progress Notes (Signed)
 VIRTUAL VISIT VIA VIDEO  I connected with Amy Delacruz on 01/20/24 at  7:40 AM EDT by a video enabled telemedicine application and verified that I am speaking with the correct person using two identifiers. Location patient: Home Location provider: [facility], Office Persons participating in the virtual visit: Patient, Dr. Claiborne Billings and Ivonne Andrew, CMA  I discussed the limitations of evaluation and management by telemedicine and the availability of in person appointments. The patient expressed understanding and agreed to proceed.     Amy Delacruz , [birth year], 48 y.o., female MRN: [id number] Patient Care Team    Relationship Specialty Notifications Start End  Natalia Leatherwood, DO PCP - General Family Medicine  10/06/16   Warden Fillers, MD    10/07/16    Comment: endocrine  Konrad Penta, MD Referring Physician Gastroenterology  08/25/21   Kandice Hams, MD Referring Physician Dermatology  08/25/21   Monica Becton, MD Consulting Physician Sports Medicine  08/25/21   Warden Fillers, MD  Endocrinology  08/26/22   Lennart Pall, MD Consulting Physician Obstetrics and Gynecology  08/31/23     Chief Complaint  Patient presents with   Anxiety    4-6 week follow up after medication/treatment changes     Subjective: Amy Delacruz is a 48 y.o. Pt presents for an OV to discuss anxiety and recent medication changes.  She had been prescribed lexapro 10-20 mg off/on since 2017. She reported initially tolerating medication w/out SE, but later has noticed decreased libido SE. We discussed options on altering her treatment regimen, and she decided to try effexor since her daughters on that medication and it seems to work well for them. It was explained to her any SSRI or SNRI has the potential to cause sexual SE to different degrees. She was provided instructions on tapering off the lexapro and onto effexor.  -Decrease Lexapro to 10 mg daily for 7 days, while  starting Effexor 37.5 mg - Stop Lexapro on day 8, and increase Effexor to 75 mg daily - Hopefully the Effexor will also have a positive benefit with her migraine headaches. Today she reports she has not really noticed any negative side effects to the medication.  She also not noticed that it is helping with her focus or anxiety anymore but then the Lexapro had.  She is still having the decrease in sexual desire.  She reports her irritable bowel has improved.      12/09/2023    7:49 AM 02/19/2023    7:56 AM 01/27/2023   10:38 AM 08/25/2021    9:10 AM 08/20/2020    9:38 AM  Depression screen PHQ 2/9  Decreased Interest 0 0 0 2 0  Down, Depressed, Hopeless 0 0 0 1 0  PHQ - 2 Score 0 0 0 3 0  Altered sleeping 0   3   Tired, decreased energy 1   2   Change in appetite 0   3   Feeling bad or failure about yourself  0   0   Trouble concentrating 0   1   Moving slowly or fidgety/restless 0   0   Suicidal thoughts 0   0   PHQ-9 Score 1   12   Difficult doing work/chores Not difficult at all        Allergies  Allergen Reactions   Other Hives    Mandarin oranges   Social History   Social History Narrative  Married to [place]. 2 children [name] and [place].    SAHM. Post grad education.    Nonsmoker.    Drinks caffeine, takes a daily vitamin   Wears seatbelt, bicycle helmet, exercises routinely.    Smoke detector in the home.    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 Overview:  neg CXR s/p tx   Perioral dermatitis 12/13/2017   Rosacea    Scoliosis    Seasonal allergies    Urticaria 08/2016   Urticaria, chronic 10/06/2016   Varicella    Vitamin D deficiency    Past Surgical History:  Procedure Laterality Date   DILATION AND CURETTAGE OF UTERUS  2004   2004,2007   SKIN LESION EXCISION     TONSILLECTOMY  1978   WISDOM TOOTH EXTRACTION  2000   Family History  Problem Relation Age of Onset   Breast cancer Paternal Grandmother 95   Hypertension Mother    Lung disease Mother    Arthritis Mother    Hypertension Father    Bleeding Disorder Father    Stroke Maternal Grandmother    Hypertension Maternal Grandmother    Depression Sister    Depression Maternal Aunt    Allergies as of 01/20/2024       Reactions   Other Hives   Mandarin oranges        Medication List        Accurate as of January 20, 2024  7:53 AM. If you have any questions, ask your nurse or doctor.          STOP taking these medications    escitalopram 20 MG tablet Commonly known as: LEXAPRO Stopped by: Felix Pacini   Flowflex COVID-19 Ag Home Test Kit Generic drug: COVID-19 At Home Antigen Test Stopped by: Felix Pacini       TAKE these medications    eletriptan 20 MG tablet Commonly known as: RELPAX TAKE ONE TABLET BY MOUTH AT ONSET OF HEADACHE AND MAY REPEAT IN 2 HOURS IF NEEDED   methimazole 5 MG tablet Commonly known as: TAPAZOLE   METRONIDAZOLE (TOPICAL) 0.75 % Lotn in the morning and at bedtime.   venlafaxine XR 150 MG 24 hr capsule Commonly known as: Effexor XR Take 1 capsule (150 mg total) by mouth daily with breakfast. What changed:  medication strength how much to take Another medication with the same name was removed. Continue taking this medication, and follow the directions you see here. Changed by: Felix Pacini        All past medical history, surgical history,  allergies, family history, immunizations andmedications were updated in the EMR today and reviewed under the history and medication portions of their EMR.     ROS Negative, with the exception of above mentioned in HPI   Objective:  There were no vitals taken for this visit. There is  no height or weight on file to calculate BMI. Physical Exam Vitals and nursing note reviewed.  Constitutional:      General: She is not in acute distress.    Appearance: Normal appearance. She is normal weight. She is not ill-appearing or toxic-appearing.  HENT:     Head: Normocephalic and atraumatic.  Eyes:     General: No scleral icterus.       Right eye: No discharge.        Left eye: No discharge.     Extraocular Movements: Extraocular movements intact.     Conjunctiva/sclera: Conjunctivae normal.     Pupils: Pupils are equal, round, and reactive to light.  Pulmonary:     Effort: Pulmonary effort is normal.  Skin:    Findings: No rash.  Neurological:     Mental Status: She is alert and oriented to person, place, and time. Mental status is at baseline.     Motor: No weakness.     Coordination: Coordination normal.     Gait: Gait normal.  Psychiatric:        Mood and Affect: Mood normal.        Behavior: Behavior normal.        Thought Content: Thought content normal.        Judgment: Judgment normal.     No results found. No results found. No results found for this or any previous visit (from the past 24 hours).  Assessment/Plan: Amy Delacruz is a 48 y.o. female present for OV for  Anxiety state (Primary)/Graves' disease Room for improvement, but doing okay. - Increase Effexor to 75 mg daily to 150 mg daily.  We discussed keeping Effexor 75 mg and adding BuSpar as well but she decided to increase the Effexor today. - Patient understands never stop medication abruptly, this medication must be weaned off slowly if she decides to discontinue. Follow-up in 5.5 months, sooner if  needed  Reviewed expectations re: course of current medical issues. Discussed self-management of symptoms. Outlined signs and symptoms indicating need for more acute intervention. Patient verbalized understanding and all questions were answered. Patient received an After-Visit Summary.    No orders of the defined types were placed in this encounter.  Meds ordered this encounter  Medications   venlafaxine XR (EFFEXOR XR) 150 MG 24 hr capsule    Sig: Take 1 capsule (150 mg total) by mouth daily with breakfast.    Dispense:  90 capsule    Refill:  1   Referral Orders  No referral(s) requested today     Note is dictated utilizing voice recognition software. Although note has been proof read prior to signing, occasional typographical errors still can be missed. If any questions arise, please do not hesitate to call for verification.   electronically signed by:  Felix Pacini, DO  [facility] - [place]

## 2024-02-07 ENCOUNTER — Encounter: Payer: Self-pay | Admitting: Family Medicine

## 2024-02-08 NOTE — Telephone Encounter (Signed)
 Is not a common side effect.  It potentially could be from the Effexor, but I would make an appointment soon as possible so that we can check some lab work to ensure there is not another condition occurring surrounding your liver enzymes and a muscle enzyme called CK.  Make sure you are hydrating well with plenty of water and if discomfort becomes worse be seen immediately.

## 2024-02-09 ENCOUNTER — Encounter: Payer: Self-pay | Admitting: Family Medicine

## 2024-02-09 ENCOUNTER — Telehealth: Payer: Self-pay

## 2024-02-09 ENCOUNTER — Ambulatory Visit: Admitting: Family Medicine

## 2024-02-09 VITALS — BP 108/74 | HR 73 | Temp 98.1°F | Wt 111.0 lb

## 2024-02-09 DIAGNOSIS — M791 Myalgia, unspecified site: Secondary | ICD-10-CM | POA: Diagnosis not present

## 2024-02-09 NOTE — Progress Notes (Signed)
 Amy Delacruz , 12-06-1975, 48 y.o., female MRN: [id number] Patient Care Team    Relationship Specialty Notifications Start End  Natalia Leatherwood, DO PCP - General Family Medicine  10/06/16   Warden Fillers, MD    10/07/16    Comment: endocrine  Konrad Penta, MD Referring Physician Gastroenterology  08/25/21   Kandice Hams, MD Referring Physician Dermatology  08/25/21   Monica Becton, MD Consulting Physician Sports Medicine  08/25/21   Warden Fillers, MD  Endocrinology  08/26/22   Lennart Pall, MD Consulting Physician Obstetrics and Gynecology  08/31/23     Chief Complaint  Patient presents with   Medication Reaction    Pt states since increased the dosage of Effexor she has been experiencing muscle weakness. Arms and legs; soreness. Pt has not taken anything.      Subjective: Amy Delacruz is a 48 y.o. Pt presents for an OV with complaints of upper arm and upper leg discomfort since increasing the Effexor to 75 mg daily.   She states she does have some discomfort in her shin area as well.  She describes the sensation as feeling like "jelly "with soreness.  Muscle cramps are not sort of touch.  She works out routinely and she is not noticing any weakness in muscle groups.  Symptoms become more noticeable as the day goes on. She does have a significant past medical history of Graves' disease and elevated liver enzymes that occurred for unknown reasons, but suspected an over-the-counter multivitamin was the cause.  They have almost trended back down to normal last being 09/30/2023 with an AST of 44 and ALT of 48.  Last TSH was normal, and she has been prescribed Tapazole alternating daily doses of 10/5. She reports she has lost a little bit of weight, noticed some vision changes but contributed this to her working out and aging.     12/09/2023    7:49 AM 02/19/2023    7:56 AM 01/27/2023   10:38 AM 08/25/2021    9:10 AM 08/20/2020    9:38 AM  Depression screen  PHQ 2/9  Decreased Interest 0 0 0 2 0  Down, Depressed, Hopeless 0 0 0 1 0  PHQ - 2 Score 0 0 0 3 0  Altered sleeping 0   3   Tired, decreased energy 1   2   Change in appetite 0   3   Feeling bad or failure about yourself  0   0   Trouble concentrating 0   1   Moving slowly or fidgety/restless 0   0   Suicidal thoughts 0   0   PHQ-9 Score 1   12   Difficult doing work/chores Not difficult at all        Allergies  Allergen Reactions   Other Hives    Mandarin oranges   Social History   Social History Narrative   Married to [place]. 2 children [name] and [place].    SAHM. Post grad education.    Nonsmoker.    Drinks caffeine, takes a daily vitamin   Wears seatbelt, bicycle helmet, exercises routinely.    Smoke detector in the home.    Past Medical History:  Diagnosis Date   Abnormal Pap smear of cervix 2007   Allergy    Depression with anxiety 2007   GERD (gastroesophageal reflux disease)    Graves disease    Graves' ophthalmopathy    follows with ophthalmology  Hearing difficulty of both ears 01/30/2020   History of positive PPD    cxr negative   Lumbar spondylosis with scoliosis    Lupus erythematosus    managed by dermatology    Migraine    Migraine, chronic, without aura 07/21/2019   Nonspecific reaction to 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 Overview:  neg CXR s/p tx   Perioral dermatitis 12/13/2017   Rosacea    Scoliosis    Seasonal allergies    Urticaria 08/2016   Urticaria, chronic 10/06/2016   Varicella    Vitamin D deficiency    Past Surgical History:  Procedure Laterality Date   DILATION AND CURETTAGE OF UTERUS  2004   2004,2007   SKIN LESION EXCISION     TONSILLECTOMY  1978   WISDOM TOOTH EXTRACTION  2000   Family History  Problem Relation Age of Onset   Breast cancer Paternal Grandmother 40   Hypertension Mother    Lung disease Mother    Arthritis  Mother    Hypertension Father    Bleeding Disorder Father    Stroke Maternal Grandmother    Hypertension Maternal Grandmother    Depression Sister    Depression Maternal Aunt    Allergies as of 02/09/2024       Reactions   Other Hives   Mandarin oranges        Medication List        Accurate as of February 09, 2024  3:35 PM. If you have any questions, ask your nurse or doctor.          eletriptan 20 MG tablet Commonly known as: RELPAX TAKE ONE TABLET BY MOUTH AT ONSET OF HEADACHE AND MAY REPEAT IN 2 HOURS IF NEEDED   methimazole 5 MG tablet Commonly known as: TAPAZOLE   METRONIDAZOLE (TOPICAL) 0.75 % Lotn in the morning and at bedtime.   venlafaxine XR 150 MG 24 hr capsule Commonly known as: Effexor XR Take 1 capsule (150 mg total) by mouth daily with breakfast.        All past medical history, surgical history, allergies, family history, immunizations andmedications were updated in the EMR today and reviewed under the history and medication portions of their EMR.     ROS Negative, with the exception of above mentioned in HPI   Objective:  BP 108/74   Pulse 73   Temp 98.1 F (36.7 C)   Wt 111 lb (50.3 kg)   SpO2 97%   BMI 20.97 kg/m  Body mass index is 20.97 kg/m. Physical Exam Vitals and nursing note reviewed.  Constitutional:      General: She is not in acute distress.    Appearance: Normal appearance. She is normal weight. She is not ill-appearing or toxic-appearing.  HENT:     Head: Normocephalic and atraumatic.  Eyes:     General: No scleral icterus.       Right eye: No discharge.        Left eye: No discharge.     Extraocular Movements: Extraocular movements intact.     Conjunctiva/sclera: Conjunctivae normal.     Pupils: Pupils are equal, round, and reactive to light.  Cardiovascular:     Rate and Rhythm: Normal rate and regular rhythm.  Musculoskeletal:        General: No swelling or tenderness. Normal range of motion.     Comments:  Bilateral upper and lower muscle strength 5/5.  Neurovascularly intact distally  Skin:    Findings: No rash.  Neurological:     Mental Status: She is alert and oriented to person, place, and time. Mental status is at baseline.     Motor: No weakness.     Coordination: Coordination normal.     Gait: Gait normal.  Psychiatric:        Mood and Affect: Mood normal.        Behavior: Behavior normal.        Thought Content: Thought content normal.        Judgment: Judgment normal.      No results found. No results found. No results found for this or any previous visit (from the past 24 hours).  Assessment/Plan: Amy Delacruz is a 48 y.o. female present for OV for  Myalgia (Primary) Patient has had a positive ANA in the past, with low titers of 1: 80 speckled homogenous.  Discussed the possibility of autoimmune disorder, polymyalgia rheumatica versus endocrine, vitamin deficiency, rhabdomyolysis/liver etiology Start workup today.  If workup normal cannot rule out that this is not Effexor causing symptoms, however it would be a very unusual side effect for an adult - Comp Met (CMET) - Sedimentation rate - CK - TSH - CBC w/Diff - IBC + Ferritin - T4, free - T3, free - Vitamin D (25 hydroxy) - B12 and Folate Panel  Reviewed expectations re: course of current medical issues. Discussed self-management of symptoms. Outlined signs and symptoms indicating need for more acute intervention. Patient verbalized understanding and all questions were answered. Patient received an After-Visit Summary.    Orders Placed This Encounter  Procedures   Comp Met (CMET)   Sedimentation rate   CK   TSH   CBC w/Diff   IBC + Ferritin   T4, free   T3, free   Vitamin D (25 hydroxy)   B12 and Folate Panel   No orders of the defined types were placed in this encounter.  Referral Orders  No referral(s) requested today     Note is dictated utilizing voice recognition software. Although note  has been proof read prior to signing, occasional typographical errors still can be missed. If any questions arise, please do not hesitate to call for verification.   electronically signed by:  Napolean Backbone, DO  [facility] [place]

## 2024-02-09 NOTE — Telephone Encounter (Signed)
 Reason for CRM: Patient called in regarding mychart message that was sent to [facility], wanted to know if someone cal give her a callback or response back regarding this   See MyChart message.

## 2024-02-09 NOTE — Telephone Encounter (Signed)
 No further action needed.

## 2024-02-10 LAB — CBC WITH DIFFERENTIAL/PLATELET
Basophils Absolute: 0 10*3/uL (ref 0.0–0.1)
Basophils Relative: 0.6 % (ref 0.0–3.0)
Eosinophils Absolute: 0 10*3/uL (ref 0.0–0.7)
Eosinophils Relative: 0.1 % (ref 0.0–5.0)
HCT: 44.6 % (ref 36.0–46.0)
Hemoglobin: 15.1 g/dL — ABNORMAL HIGH (ref 12.0–15.0)
Lymphocytes Relative: 33.9 % (ref 12.0–46.0)
Lymphs Abs: 2.2 10*3/uL (ref 0.7–4.0)
MCHC: 33.9 g/dL (ref 30.0–36.0)
MCV: 90.1 fl (ref 78.0–100.0)
Monocytes Absolute: 0.4 10*3/uL (ref 0.1–1.0)
Monocytes Relative: 5.6 % (ref 3.0–12.0)
Neutro Abs: 4 10*3/uL (ref 1.4–7.7)
Neutrophils Relative %: 59.8 % (ref 43.0–77.0)
Platelets: 242 10*3/uL (ref 150.0–400.0)
RBC: 4.95 Mil/uL (ref 3.87–5.11)
RDW: 12.9 % (ref 11.5–15.5)
WBC: 6.6 10*3/uL (ref 4.0–10.5)

## 2024-02-10 LAB — B12 AND FOLATE PANEL
Folate: 20.6 ng/mL (ref >5.9)
Vitamin B-12: 277 pg/mL (ref 211–911)

## 2024-02-10 LAB — T3, FREE: T3, Free: 3 pg/mL (ref 2.3–4.2)

## 2024-02-10 LAB — COMPREHENSIVE METABOLIC PANEL WITH GFR
ALT: 36 U/L — ABNORMAL HIGH (ref 0–35)
AST: 33 U/L (ref 0–37)
Albumin: 4.7 g/dL (ref 3.5–5.2)
Alkaline Phosphatase: 50 U/L (ref 39–117)
BUN: 15 mg/dL (ref 6–23)
CO2: 31 meq/L (ref 19–32)
Calcium: 9.2 mg/dL (ref 8.4–10.5)
Chloride: 98 meq/L (ref 96–112)
Creatinine, Ser: 0.79 mg/dL (ref 0.40–1.20)
GFR: 87.82 mL/min (ref >60.00)
Glucose, Bld: 82 mg/dL (ref 70–99)
Potassium: 4.2 meq/L (ref 3.5–5.1)
Sodium: 135 meq/L (ref 135–145)
Total Bilirubin: 0.6 mg/dL (ref 0.2–1.2)
Total Protein: 7.1 g/dL (ref 6.0–8.3)

## 2024-02-10 LAB — CK: Total CK: 105 U/L (ref 7–177)

## 2024-02-10 LAB — VITAMIN D 25 HYDROXY (VIT D DEFICIENCY, FRACTURES): VITD: 35.03 ng/mL (ref 30.00–100.00)

## 2024-02-10 LAB — T4, FREE: Free T4: 0.84 ng/dL (ref 0.60–1.60)

## 2024-02-10 LAB — IBC + FERRITIN
Ferritin: 47.5 ng/mL (ref 10.0–291.0)
Iron: 112 ug/dL (ref 42–145)
Saturation Ratios: 37.4 % (ref 20.0–50.0)
TIBC: 299.6 ug/dL (ref 250.0–450.0)
Transferrin: 214 mg/dL (ref 212.0–360.0)

## 2024-02-10 LAB — TSH: TSH: 1.39 u[IU]/mL (ref 0.35–5.50)

## 2024-02-10 LAB — SEDIMENTATION RATE: Sed Rate: 3 mm/h (ref 0–20)

## 2024-02-11 ENCOUNTER — Encounter: Payer: Self-pay | Admitting: Family Medicine

## 2024-02-15 MED ORDER — VENLAFAXINE HCL ER 75 MG PO CP24: 75.0000 mg | ORAL_CAPSULE | Freq: Every day | ORAL | 1 refills | Status: DC

## 2024-02-15 NOTE — Addendum Note (Signed)
 Addended by: Napolean Backbone A on: 02/15/2024 03:31 PM   Modules accepted: Orders

## 2024-05-05 ENCOUNTER — Encounter: Payer: Self-pay | Admitting: Family Medicine

## 2024-05-08 NOTE — Telephone Encounter (Signed)
 Please call pt and offer a virtual appt to discuss

## 2024-05-09 ENCOUNTER — Encounter (INDEPENDENT_AMBULATORY_CARE_PROVIDER_SITE_OTHER): Payer: Self-pay

## 2024-05-09 NOTE — Telephone Encounter (Signed)
Pt is scheduled for 07/22

## 2024-05-16 ENCOUNTER — Ambulatory Visit: Admitting: Family Medicine

## 2024-06-23 ENCOUNTER — Other Ambulatory Visit: Payer: Self-pay | Admitting: Ophthalmology

## 2024-06-23 DIAGNOSIS — H05241 Constant exophthalmos, right eye: Secondary | ICD-10-CM

## 2024-06-23 DIAGNOSIS — H05242 Constant exophthalmos, left eye: Secondary | ICD-10-CM

## 2024-06-27 ENCOUNTER — Encounter: Payer: Self-pay | Admitting: Sports Medicine

## 2024-06-27 ENCOUNTER — Ambulatory Visit

## 2024-06-27 DIAGNOSIS — H05242 Constant exophthalmos, left eye: Secondary | ICD-10-CM

## 2024-06-27 DIAGNOSIS — H5711 Ocular pain, right eye: Secondary | ICD-10-CM | POA: Diagnosis not present

## 2024-06-27 DIAGNOSIS — Z8639 Personal history of other endocrine, nutritional and metabolic disease: Secondary | ICD-10-CM | POA: Diagnosis not present

## 2024-06-27 DIAGNOSIS — Z09 Encounter for follow-up examination after completed treatment for conditions other than malignant neoplasm: Secondary | ICD-10-CM

## 2024-06-27 DIAGNOSIS — H05241 Constant exophthalmos, right eye: Secondary | ICD-10-CM

## 2024-07-26 ENCOUNTER — Encounter: Payer: Self-pay | Admitting: Obstetrics and Gynecology

## 2024-07-27 MED ORDER — TRANEXAMIC ACID 650 MG PO TABS: 1300.0000 mg | ORAL_TABLET | Freq: Three times a day (TID) | ORAL | 2 refills | Status: DC

## 2024-08-09 ENCOUNTER — Other Ambulatory Visit: Payer: Self-pay | Admitting: Family Medicine

## 2024-08-10 ENCOUNTER — Encounter: Payer: Self-pay | Admitting: Family Medicine

## 2024-08-14 ENCOUNTER — Other Ambulatory Visit: Payer: Self-pay | Admitting: Family Medicine

## 2024-08-14 DIAGNOSIS — Z1231 Encounter for screening mammogram for malignant neoplasm of breast: Secondary | ICD-10-CM

## 2024-08-16 ENCOUNTER — Other Ambulatory Visit (HOSPITAL_COMMUNITY)
Admission: RE | Admit: 2024-08-16 | Discharge: 2024-08-16 | Disposition: A | Discharge status: Home / Self Care | Source: Ambulatory Visit | Attending: Obstetrics and Gynecology | Admitting: Obstetrics and Gynecology

## 2024-08-16 ENCOUNTER — Encounter: Payer: Self-pay | Admitting: Obstetrics and Gynecology

## 2024-08-16 ENCOUNTER — Ambulatory Visit (INDEPENDENT_AMBULATORY_CARE_PROVIDER_SITE_OTHER): Admitting: Obstetrics and Gynecology

## 2024-08-16 VITALS — BP 109/72 | HR 92 | Ht 60.0 in | Wt 111.1 lb

## 2024-08-16 DIAGNOSIS — Z01419 Encounter for gynecological examination (general) (routine) without abnormal findings: Secondary | ICD-10-CM | POA: Diagnosis present

## 2024-08-16 NOTE — Patient Instructions (Addendum)
 Diagnosis code - N 9.39 Abnormal uterine bleeding CPT - 58100 Endometrial Biopsy  It was nice to catch up with you today! You will see your results in the MyChart app within 1 week

## 2024-08-16 NOTE — Progress Notes (Signed)
 ANNUAL EXAM Patient name: Amy Delacruz MRN [id number]  Date of birth: [birth year] Chief Complaint:   Gynecologic Exam  History of Present Illness:   Amy Delacruz is a 48 y.o. [phone] with Patient's last menstrual period was 07/06/2024. being seen today for a routine annual exam.  Current complaints:  Had 30 days of bleeding recently that ultimately stopped after taking lysteda . This is the second time this has happened.  Dealing with Graves disease and periods have been spacing due to menopause transition. Having some cold sweats but that got better with thyroid  treatment.  [place] is freshman at [employer]. Transition has been stressful on top of her health stressors.   Last pap 12/28/19. Results were: NILM w/ HRHPV negative. H/O abnormal pap: yes ~18 years ago just needed a closer interval pap, no colpo/LEEP Last mammogram: 08/18/23. Results were: normal.  Last colonoscopy: 2023. Results were: normal     08/16/2024   10:05 AM 12/09/2023    7:49 AM 02/19/2023    7:56 AM 01/27/2023   10:38 AM 08/25/2021    9:10 AM  Depression screen PHQ 2/9  Decreased Interest 1 0 0 0 2  Down, Depressed, Hopeless 0 0 0 0 1  PHQ - 2 Score 1 0 0 0 3  Altered sleeping 0 0   3  Tired, decreased energy 1 1   2   Change in appetite 0 0   3  Feeling bad or failure about yourself  0 0   0  Trouble concentrating 0 0   1  Moving slowly or fidgety/restless 0 0   0  Suicidal thoughts 0 0   0  PHQ-9 Score 2 1   12   Difficult doing work/chores  Not difficult at all         08/16/2024   10:06 AM 12/09/2023    7:50 AM 08/25/2021    9:10 AM 07/21/2019    8:06 AM  GAD 7 : Generalized Anxiety Score  Nervous, Anxious, on Edge 1 1 2 2   Control/stop worrying 1 1 1  0  Worry too much - different things 1 1 2 3   Trouble relaxing 1 1 2 3   Restless 1 1 2 3   Easily annoyed or irritable 1 0 2 1  Afraid - awful might happen 0 0 1 1  Total GAD 7 Score 6 5 12 13   Anxiety Difficulty  Somewhat difficult  Very difficult      Review of Systems:   Pertinent items are noted in HPI Denies any headaches, blurred vision, fatigue, shortness of breath, chest pain, abdominal pain, abnormal vaginal discharge/itching/odor/irritation, problems with periods, bowel movements, urination, or intercourse unless otherwise stated above. Pertinent History Reviewed:  Reviewed past medical,surgical, social and family history.  Reviewed problem list, medications and allergies. Physical Assessment:   Vitals:   08/16/24 0957  BP: 109/72  Pulse: 92  Weight: 111 lb 1.3 oz (50.4 kg)  Height: 5' (1.524 m)  Body mass index is 21.69 kg/m.        Physical Examination:   General appearance - well appearing, and in no distress  Mental status - alert, oriented to person, place, and time  Chest - respiratory effort normal  Heart - normal peripheral perfusion  Breasts - breasts appear normal, no suspicious masses, no skin or nipple changes or axillary nodes  Abdomen - soft, nontender, nondistended, no masses or organomegaly  Pelvic - VULVA: normal appearing vulva with no masses, tenderness or lesions  VAGINA: normal  appearing vagina with normal color and discharge, no lesions  CERVIX: normal appearing cervix without discharge or lesions, no CMT  Thin prep pap is done with HR HPV cotesting  Chaperone present for exam  No results found for this or any previous visit (from the past 24 hours).  Assessment & Plan:  1) Well-Woman Exam Mammogram: Scheduled 08/30/24. Breast self awareness discussed Colonoscopy: Due 2033 Pap: Collected today  2) AUB Recommend EMB for age > 61 and now second episode of prolonged bleeding. Still suspect this is due to an anovulatory cycle from menopause transition but meets criteria for Emb Discussed options for management including (but not limited to) lysteda  prn, PO progestins, and LNG IUD Reviewed candidates for menopausal hormone therapy; would not start until after EMB/bleeding controlled Prefers  expectant management. Would like to make sure EMB will be covered by insurance and schedule in 2026  Follow-up: Return in about 3 months (around 11/16/2024) for endometrial biopsy.  Kieth JAYSON Carolin, MD 08/16/2024 1:40 PM

## 2024-08-17 ENCOUNTER — Ambulatory Visit: Admitting: Family Medicine

## 2024-08-21 ENCOUNTER — Ambulatory Visit: Payer: Self-pay | Admitting: Obstetrics and Gynecology

## 2024-08-21 LAB — CYTOLOGY - PAP
Comment: NEGATIVE
Diagnosis: UNDETERMINED — AB
High risk HPV: NEGATIVE

## 2024-08-30 ENCOUNTER — Ambulatory Visit
Admission: RE | Admit: 2024-08-30 | Discharge: 2024-08-30 | Disposition: A | Discharge status: Home / Self Care | Source: Ambulatory Visit

## 2024-08-30 DIAGNOSIS — Z1231 Encounter for screening mammogram for malignant neoplasm of breast: Secondary | ICD-10-CM

## 2024-09-01 ENCOUNTER — Ambulatory Visit: Payer: Self-pay | Admitting: Family Medicine

## 2024-09-03 ENCOUNTER — Other Ambulatory Visit: Payer: Self-pay | Admitting: Family Medicine

## 2024-09-04 ENCOUNTER — Encounter: Payer: Self-pay | Admitting: Family Medicine

## 2024-09-05 ENCOUNTER — Ambulatory Visit: Payer: 59 | Admitting: Family Medicine

## 2024-09-05 ENCOUNTER — Encounter: Payer: Self-pay | Admitting: Family Medicine

## 2024-09-05 VITALS — BP 114/74 | HR 76 | Temp 98.2°F | Ht 61.0 in | Wt 112.4 lb

## 2024-09-05 DIAGNOSIS — Z1231 Encounter for screening mammogram for malignant neoplasm of breast: Secondary | ICD-10-CM

## 2024-09-05 DIAGNOSIS — Z13220 Encounter for screening for lipoid disorders: Secondary | ICD-10-CM | POA: Diagnosis not present

## 2024-09-05 DIAGNOSIS — Z0000 Encounter for general adult medical examination without abnormal findings: Secondary | ICD-10-CM

## 2024-09-05 DIAGNOSIS — F411 Generalized anxiety disorder: Secondary | ICD-10-CM

## 2024-09-05 DIAGNOSIS — Z23 Encounter for immunization: Secondary | ICD-10-CM | POA: Diagnosis not present

## 2024-09-05 LAB — CBC
HCT: 43.1 % (ref 36.0–46.0)
Hemoglobin: 14.6 g/dL (ref 12.0–15.0)
MCHC: 33.9 g/dL (ref 30.0–36.0)
MCV: 90.9 fl (ref 78.0–100.0)
Platelets: 244 K/uL (ref 150.0–400.0)
RBC: 4.74 Mil/uL (ref 3.87–5.11)
RDW: 13 % (ref 11.5–15.5)
WBC: 5.2 K/uL (ref 4.0–10.5)

## 2024-09-05 LAB — LIPID PANEL
Cholesterol: 187 mg/dL (ref 0–200)
HDL: 68.4 mg/dL (ref >39.00)
LDL Cholesterol: 101 mg/dL — ABNORMAL HIGH (ref 0–99)
NonHDL: 118.67
Total CHOL/HDL Ratio: 3
Triglycerides: 88 mg/dL (ref 0.0–149.0)
VLDL: 17.6 mg/dL (ref 0.0–40.0)

## 2024-09-05 MED ORDER — VENLAFAXINE HCL ER 37.5 MG PO CP24: 37.5000 mg | ORAL_CAPSULE | Freq: Every day | ORAL | 1 refills | Status: AC

## 2024-09-05 MED ORDER — VENLAFAXINE HCL ER 75 MG PO CP24: 75.0000 mg | ORAL_CAPSULE | Freq: Every day | ORAL | 1 refills | Status: AC

## 2024-09-05 NOTE — Progress Notes (Signed)
 Patient ID: Amy Delacruz, female  DOB: 07/01/1976, 48 y.o.   MRN: [id number] Patient Care Team    Relationship Specialty Notifications Start End  Catherine Charlies LABOR, DO PCP - General Family Medicine  10/06/16   Lucio Elsie BROCKS, MD Referring Physician Gastroenterology  08/25/21   Hosie Rush, MD  Endocrinology  08/26/22   Erik Kieth BROCKS, MD Consulting Physician Obstetrics and Gynecology  08/31/23   Reda Hair, MD Referring Physician Dermatology  09/05/24     Chief Complaint  Patient presents with   Annual Exam    Pt is fasting.  With combined chronic condition appointment. Vaccinations to discuss: Prevnar, Shingrix, hepatitis B, Tdap- declined all for now, will schedule nurse visit.    Subjective: Amy Delacruz is a 48 y.o.  Female  present for CPE and combined chronic condition management appointment All past medical history, surgical history, allergies, family history, immunizations, medications and social history were updated in the electronic medical record today. All recent labs, ED visits and hospitalizations within the last year were reviewed.   Health maintenance:  Colon cancer screen: Completed 06/24/2021>Dr. Bray> 10 yr per pt.  Mammogram: completed: 08/30/2024. - ordered by [facility] Cervical cancer screening: last pap: 08/16/2024 gyn Immunizations: tdap UTD 12/2014 - declined, Influenza completed (encouraged yearly), covid series completed, Prevnar-declined, hepatitis B- declined, Shingrix- declined Infectious disease screening: HIV completed Hep c completed DEXA: Completed 07/24/2020 normal. Patient has a Dental home. Hospitalizations/ED visits: Reviewed   Reviewed labs from specialty teams: 06/19/2024: TSH, T4 free, BMP> within normal limits.  ALT still mildly elevated at 40.   Anxiety: Patient reports compliant with Effexor  75 mg daily.  She does need refills on this today and would like to consider increasing 37.5 mg daily due to job stress and brain  looping always in thought. .     08/16/2024   10:05 AM 12/09/2023    7:49 AM 02/19/2023    7:56 AM 01/27/2023   10:38 AM 08/25/2021    9:10 AM  Depression screen PHQ 2/9  Decreased Interest 1 0 0 0 2  Down, Depressed, Hopeless 0 0 0 0 1  PHQ - 2 Score 1 0 0 0 3  Altered sleeping 0 0   3  Tired, decreased energy 1 1   2   Change in appetite 0 0   3  Feeling bad or failure about yourself  0 0   0  Trouble concentrating 0 0   1  Moving slowly or fidgety/restless 0 0   0  Suicidal thoughts 0 0   0  PHQ-9 Score 2  1    12    Difficult doing work/chores  Not difficult at all        Data saved with a previous flowsheet row definition      08/16/2024   10:06 AM 12/09/2023    7:50 AM 08/25/2021    9:10 AM 07/21/2019    8:06 AM  GAD 7 : Generalized Anxiety Score  Nervous, Anxious, on Edge 1 1 2 2   Control/stop worrying 1 1 1  0  Worry too much - different things 1 1 2 3   Trouble relaxing 1 1 2 3   Restless 1 1 2 3   Easily annoyed or irritable 1 0 2 1  Afraid - awful might happen 0 0 1 1  Total GAD 7 Score 6 5 12 13   Anxiety Difficulty  Somewhat difficult  Very difficult    Immunization History  Administered Date(s)  Administered   Influenza, Quadrivalent, Recombinant, Inj, Pf 07/26/2017, 07/03/2018   Influenza, Seasonal, Injecte, Preservative Fre 08/02/2023, 08/02/2024   Influenza,inj,Quad PF,6+ Mos 08/03/2017, 08/02/2021, 08/08/2022, 07/31/2024   Influenza,inj,Quad PF,6-35 Mos 07/03/2018, 07/09/2019   Influenza,inj,quad, With Preservative 07/03/2018, 07/09/2019   Influenza-Unspecified 08/04/2010, 06/27/2015, 06/26/2016, 08/03/2017, 07/03/2018, 07/09/2019, 07/28/2020, 08/02/2021, 07/27/2023   PFIZER(Purple Top)SARS-COV-2 Vaccination 01/21/2020, 02/11/2020, 09/12/2020, 09/14/2020   Pfizer Covid-19 Vaccine Bivalent Booster 56yrs & up 07/13/2021   Pfizer(Comirnaty)Fall Seasonal Vaccine 12 years and older 08/27/2022, 08/02/2023, 08/02/2024   Tdap 12/05/2010, 01/02/2015   Unspecified  SARS-COV-2 Vaccination 07/27/2023, 07/31/2024    Past Medical History:  Diagnosis Date   Abnormal Pap smear of cervix 2007   Allergy    Depression with anxiety 2007   GERD (gastroesophageal reflux disease)    Graves disease    Graves' ophthalmopathy    follows with ophthalmology   Hearing difficulty of both ears 01/30/2020   History of positive PPD    cxr negative   Lumbar spondylosis with scoliosis    Lupus erythematosus    managed by dermatology    Migraine    Migraine, chronic, without aura 07/21/2019   Nonspecific reaction to 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 Overview:  neg CXR s/p tx   Perioral dermatitis 12/13/2017   Right lumbar radiculopathy 01/19/2023   Rosacea    Scoliosis    Seasonal allergies    Urticaria 08/2016   Urticaria, chronic 10/06/2016   Varicella    Vitamin D  deficiency    No Active Allergies  Past Surgical History:  Procedure Laterality Date   DILATION AND CURETTAGE OF UTERUS  2004   2004,2007   SKIN LESION EXCISION     TONSILLECTOMY  1978   WISDOM TOOTH EXTRACTION  2000   Family History  Problem Relation Age of Onset   Breast cancer Paternal Grandmother 80   Stroke Maternal Grandmother    Hypertension Maternal Grandmother    Hypertension Father    Bleeding Disorder Father    Hypertension Mother    Lung disease Mother    Arthritis Mother    Arthritis/Rheumatoid Mother    Depression Sister    Depression Maternal Aunt    Social History   Social History Narrative   Married to [place]. 2 children [place] and [place].    SAHM. Post grad education.    Nonsmoker.    Drinks caffeine, takes a daily vitamin   Wears seatbelt, bicycle helmet, exercises routinely.    Smoke detector in the home.     Allergies as of 09/05/2024   No Active Allergies      Medication List        Accurate as of September 05, 2024  2:24 PM. If you have any questions, ask  your nurse or doctor.          STOP taking these medications    eletriptan  20 MG tablet Commonly known as: RELPAX  Stopped by: Javan Gonzaga   METRONIDAZOLE  (TOPICAL) 0.75 % Lotn Stopped by: Charlies Bellini   tranexamic acid  650 MG Tabs tablet Commonly known as: LYSTEDA  Stopped by: Charlies Bellini       TAKE these medications    methimazole  5 MG tablet Commonly known as: TAPAZOLE    venlafaxine  XR 75 MG 24 hr capsule Commonly known as: EFFEXOR -XR Take 1 capsule (75 mg total) by mouth daily with breakfast. What changed: Another medication with the same name was added. Make sure you understand  how and when to take each. Changed by: Evalin Shawhan   venlafaxine  XR 37.5 MG 24 hr capsule Commonly known as: Effexor  XR Take 1 capsule (37.5 mg total) by mouth daily with breakfast. What changed: You were already taking a medication with the same name, and this prescription was added. Make sure you understand how and when to take each. Changed by: Charlies Bellini        All past medical history, surgical history, allergies, family history, immunizations andmedications were updated in the EMR today and reviewed under the history and medication portions of their EMR.     Recent Results (from the past 2160 hours)  Cytology - PAP     Status: Abnormal   Collection Time: 08/16/24 10:41 AM  Result Value Ref Range   High risk HPV Negative    Adequacy      Satisfactory for evaluation; transformation zone component PRESENT.   Diagnosis (A)     - Atypical squamous cells of undetermined significance (ASC-US )   Comment Normal Reference Range HPV - Negative    Review of Systems  All other systems reviewed and are negative.   ROS: 14 pt review of systems performed and negative (unless mentioned in an HPI)  Objective: BP 114/74   Pulse 76   Temp 98.2 F (36.8 C)   Ht 5' 1 (1.549 m)   Wt 112 lb 6.4 oz (51 kg)   LMP 08/05/2024 (Exact Date)   SpO2 98%   BMI 21.24 kg/m  Physical  Exam Vitals and nursing note reviewed.  Constitutional:      General: She is not in acute distress.    Appearance: Normal appearance. She is not ill-appearing or toxic-appearing.  HENT:     Head: Normocephalic and atraumatic.     Right Ear: Tympanic membrane, ear canal and external ear normal. There is no impacted cerumen.     Left Ear: Tympanic membrane, ear canal and external ear normal. There is no impacted cerumen.     Nose: No congestion or rhinorrhea.     Mouth/Throat:     Mouth: Mucous membranes are moist.     Pharynx: Oropharynx is clear. No oropharyngeal exudate or posterior oropharyngeal erythema.  Eyes:     General: No scleral icterus.       Right eye: No discharge.        Left eye: No discharge.     Extraocular Movements: Extraocular movements intact.     Conjunctiva/sclera: Conjunctivae normal.     Pupils: Pupils are equal, round, and reactive to light.  Cardiovascular:     Rate and Rhythm: Normal rate and regular rhythm.     Pulses: Normal pulses.     Heart sounds: Normal heart sounds. No murmur heard.    No friction rub. No gallop.  Pulmonary:     Effort: Pulmonary effort is normal. No respiratory distress.     Breath sounds: Normal breath sounds. No stridor. No wheezing, rhonchi or rales.  Chest:     Chest wall: No tenderness.  Abdominal:     General: Abdomen is flat. Bowel sounds are normal. There is no distension.     Palpations: Abdomen is soft. There is no mass.     Tenderness: There is no abdominal tenderness. There is no right CVA tenderness, left CVA tenderness, guarding or rebound.     Hernia: No hernia is present.  Musculoskeletal:        General: No swelling, tenderness or deformity. Normal range of motion.  Cervical back: Normal range of motion and neck supple. No rigidity or tenderness.     Right lower leg: No edema.     Left lower leg: No edema.  Lymphadenopathy:     Cervical: No cervical adenopathy.  Skin:    General: Skin is warm and dry.      Coloration: Skin is not jaundiced or pale.     Findings: No bruising, erythema, lesion or rash.  Neurological:     General: No focal deficit present.     Mental Status: She is alert and oriented to person, place, and time. Mental status is at baseline.     Cranial Nerves: No cranial nerve deficit.     Sensory: No sensory deficit.     Motor: No weakness.     Coordination: Coordination normal.     Gait: Gait normal.     Deep Tendon Reflexes: Reflexes normal.  Psychiatric:        Mood and Affect: Mood normal.        Behavior: Behavior normal.        Thought Content: Thought content normal.        Judgment: Judgment normal.    No results found.  Assessment/plan: Amy Delacruz is a 48 y.o. female present for cpe Routine general medical examination at a health care facility Patient was encouraged to exercise greater than 150 minutes a week. Patient was encouraged to choose a diet filled with fresh fruits and vegetables, and lean meats. AVS provided to patient today for education/recommendation on gender specific health and safety maintenance. Colon cancer screen: Completed 06/24/2021>Dr. Bray> 10 yr per pt.  Mammogram: completed: 08/30/2024. - ordered by [facility] Cervical cancer screening: last pap: 08/16/2024 gyn Immunizations: tdap UTD 12/2014 - declined, Influenza completed (encouraged yearly), covid series completed, Prevnar-declined, hepatitis B- declined, Shingrix- declined Infectious disease screening: HIV completed Hep c completed DEXA: Completed 07/24/2020 normal.  Anxiety state Discussed her current increase in stress, mostly surrounding work. She is seeing a therapist She is considering her options as far as her workload Continue Effexor  75 mg daily, and will temporarily add on Effexor  37.5 mg dose (which she can discontinue had on dose at any time if desired.)  Need for zoster vaccination She has Shingrix vaccine schedule from her [facility] Need for vaccination for  pneumococcus She will schedule at [facility] Need for hepatitis B vaccination She can make a nurse visit schedule if desired Need for Tdap vaccination Can have nurse visit to schedule if desired Breast cancer screening by mammogram - MM 3D SCREENING MAMMOGRAM BILATERAL BREAST; Future Lipid screening - Lipid panel   Return in about 6 months (around 02/28/2025) for Routine chronic condition follow-up.   Orders Placed This Encounter  Procedures   MM 3D SCREENING MAMMOGRAM BILATERAL BREAST   CBC   Lipid panel    Meds ordered this encounter  Medications   venlafaxine  XR (EFFEXOR -XR) 75 MG 24 hr capsule    Sig: Take 1 capsule (75 mg total) by mouth daily with breakfast.    Dispense:  90 capsule    Refill:  1    Total dose 117.5 mg (75 and/+37.5)   venlafaxine  XR (EFFEXOR  XR) 37.5 MG 24 hr capsule    Sig: Take 1 capsule (37.5 mg total) by mouth daily with breakfast.    Dispense:  90 capsule    Refill:  1    Total dose 117.5 mg (75 and/+37.5)    Referral Orders  No referral(s) requested today  Electronically signed by: Charlies Bellini, DO [facility] [place]

## 2024-09-05 NOTE — Patient Instructions (Addendum)
 Return in about 6 months (around 02/28/2025) for Routine chronic condition follow-up.  B12 sublingual 1000 mcg daily      Great to see you today.  I have refilled the medication(s) we provide.   If labs were collected or images ordered, we will inform you of  results once we have received them and reviewed. We will contact you either by echart message, or telephone call.  Please give ample time to the testing facility, and our office to run,  receive and review results. Please do not call inquiring of results, even if you can see them in your chart. We will contact you as soon as we are able. If it has been over 1 week since the test was completed, and you have not yet heard from us , then please call us .    - echart message- for normal results that have been seen by the patient already.   - telephone call: abnormal results or if patient has not viewed results in their echart.  If a referral to a specialist was entered for you, please call us  in 2 weeks if you have not heard from the specialist office to schedule.

## 2024-09-06 ENCOUNTER — Encounter: Payer: Self-pay | Admitting: Family Medicine

## 2024-09-06 ENCOUNTER — Ambulatory Visit: Payer: Self-pay | Admitting: Family Medicine

## 2024-11-30 ENCOUNTER — Encounter

## 2024-11-30 DIAGNOSIS — Z1231 Encounter for screening mammogram for malignant neoplasm of breast: Secondary | ICD-10-CM

## 2025-02-27 ENCOUNTER — Ambulatory Visit: Admitting: Family Medicine
